# Patient Record
Sex: Female | Born: 1990 | Race: Black or African American | Hispanic: No | Marital: Single | Smoking: Former smoker
Health system: Southern US, Community
[De-identification: ages and names within clinical notes are randomized; demographics above are authoritative.]

## PROBLEM LIST (undated history)

## (undated) DIAGNOSIS — F419 Anxiety disorder, unspecified: Secondary | ICD-10-CM

## (undated) DIAGNOSIS — F329 Major depressive disorder, single episode, unspecified: Secondary | ICD-10-CM

## (undated) DIAGNOSIS — F32A Depression, unspecified: Secondary | ICD-10-CM

## (undated) DIAGNOSIS — N83209 Unspecified ovarian cyst, unspecified side: Secondary | ICD-10-CM

## (undated) DIAGNOSIS — F319 Bipolar disorder, unspecified: Secondary | ICD-10-CM

## (undated) DIAGNOSIS — G43909 Migraine, unspecified, not intractable, without status migrainosus: Secondary | ICD-10-CM

---
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
## 2010-03-20 ENCOUNTER — Inpatient Hospital Stay (HOSPITAL_COMMUNITY)
Admission: AD | Admit: 2010-03-20 | Discharge: 2010-03-20 | Payer: Self-pay | Source: Home / Self Care | Attending: Obstetrics and Gynecology | Admitting: Obstetrics and Gynecology

## 2010-04-22 ENCOUNTER — Ambulatory Visit: Admission: RE | Admit: 2010-04-22 | Payer: Self-pay | Source: Home / Self Care | Admitting: Obstetrics and Gynecology

## 2010-04-30 ENCOUNTER — Encounter: Payer: Self-pay | Admitting: Obstetrics & Gynecology

## 2010-04-30 ENCOUNTER — Other Ambulatory Visit: Payer: Self-pay | Admitting: Obstetrics & Gynecology

## 2010-04-30 ENCOUNTER — Encounter: Payer: Medicaid Other | Admitting: Obstetrics & Gynecology

## 2010-04-30 DIAGNOSIS — N841 Polyp of cervix uteri: Secondary | ICD-10-CM

## 2010-04-30 DIAGNOSIS — N939 Abnormal uterine and vaginal bleeding, unspecified: Secondary | ICD-10-CM

## 2010-04-30 DIAGNOSIS — N926 Irregular menstruation, unspecified: Secondary | ICD-10-CM

## 2010-04-30 LAB — CONVERTED CEMR LAB: Hepatitis B Surface Ag: NEGATIVE

## 2010-04-30 LAB — POCT PREGNANCY, URINE: Preg Test, Ur: NEGATIVE

## 2010-05-25 ENCOUNTER — Ambulatory Visit (HOSPITAL_COMMUNITY): Payer: Medicaid Other

## 2010-05-25 ENCOUNTER — Inpatient Hospital Stay (HOSPITAL_COMMUNITY): Admission: RE | Admit: 2010-05-25 | Payer: Medicaid Other | Source: Ambulatory Visit

## 2010-05-25 ENCOUNTER — Ambulatory Visit: Payer: Medicaid Other | Admitting: Obstetrics & Gynecology

## 2010-06-08 LAB — GC/CHLAMYDIA PROBE AMP, GENITAL
Chlamydia, DNA Probe: NEGATIVE
GC Probe Amp, Genital: NEGATIVE

## 2010-06-08 LAB — URINALYSIS, ROUTINE W REFLEX MICROSCOPIC
Bilirubin Urine: NEGATIVE
Hgb urine dipstick: NEGATIVE
Nitrite: NEGATIVE
Urobilinogen, UA: 0.2 mg/dL (ref 0.0–1.0)

## 2010-06-08 LAB — WET PREP, GENITAL: Trich, Wet Prep: NONE SEEN

## 2010-06-08 LAB — POCT PREGNANCY, URINE: Preg Test, Ur: NEGATIVE

## 2010-06-11 ENCOUNTER — Ambulatory Visit: Payer: Medicaid Other | Admitting: Obstetrics & Gynecology

## 2010-06-15 LAB — WET PREP, GENITAL
Trich, Wet Prep: NONE SEEN
Yeast Wet Prep HPF POC: NONE SEEN

## 2010-06-15 LAB — URINALYSIS, ROUTINE W REFLEX MICROSCOPIC
Glucose, UA: NEGATIVE mg/dL
Protein, ur: NEGATIVE mg/dL

## 2010-06-15 LAB — GC/CHLAMYDIA PROBE AMP, GENITAL
Chlamydia, DNA Probe: NEGATIVE
GC Probe Amp, Genital: NEGATIVE

## 2010-06-16 ENCOUNTER — Emergency Department (HOSPITAL_COMMUNITY)
Admission: EM | Admit: 2010-06-16 | Discharge: 2010-06-16 | Disposition: A | Discharge status: Home / Self Care | Payer: Medicaid Other | Attending: Emergency Medicine | Admitting: Emergency Medicine

## 2010-06-16 DIAGNOSIS — J029 Acute pharyngitis, unspecified: Secondary | ICD-10-CM | POA: Insufficient documentation

## 2010-06-16 DIAGNOSIS — J069 Acute upper respiratory infection, unspecified: Secondary | ICD-10-CM | POA: Insufficient documentation

## 2010-06-16 DIAGNOSIS — R599 Enlarged lymph nodes, unspecified: Secondary | ICD-10-CM | POA: Insufficient documentation

## 2010-06-19 NOTE — Progress Notes (Signed)
Yolanda Wall, BARTKO NO.:  [id number]  MEDICAL RECORD NO.:  [id number]           PATIENT TYPE:  A  LOCATION:  [facility]                   FACILITY:  [facility]  PHYSICIAN:  Elsie Lincoln, MD      DATE OF BIRTH:  April 10, 1990  DATE OF SERVICE:  04/30/2010                                 CLINIC NOTE  The patient is a 20 year old female who presents for pelvic pain and irregular bleeding from the MAU.  The patient was seen in the MAU on March 20, 2010.  The patient has very vague pain.  Does not seem to be occurring currently.  She does have problems with constipation.  She has never tried docusate sodium and MiraLax and she was told to try that today.  She did have an ultrasound, which showed a normal-sized uterus with no myometrial abnormalities.  The endometrium did show slightly thick with possible endometrial polyp, so we ordered a saline sonohysterogram today.  The right ovary was normal with a probable left corpus luteum. The patient does have long periods lasting for 14 days.  PAST MEDICAL HISTORY:  Denies all medical problems.  PAST SURGICAL HISTORY:  Denies all surgeries.  SOCIAL HISTORY:  No drinking or drugs.  Has a history of tobacco.  MEDICATIONS:  None.  ALLERGIES:  NONE.  GYNECOLOGIC HISTORY:  The patient is sexually active but not using any birth control.  She has never been pregnant.  She has never had any sexually transmitted disease that she is aware of.  The patient is only 19 and does not need Pap smear.  She was recently diagnosed with BV at the MAU.  PHYSICAL EXAMINATION:  VITAL SIGNS:  Temperature 98.2, pulse 80, blood pressure 124/70, weight 135, height 70 inches. GENERAL:  Well nourished, well developed in no apparent distress. HEENT:  Normocephalic, atraumatic. CHEST:  Normal. ABDOMEN:  Soft, nontender.  No rebound or guarding. GENITALIA:  Tanner V.  Vagina, pink, normal rugae.  GC and Chlamydia cultures were over negative in  December.  Cervix, closed, nontender.  No CMT.  Uterus, anteverted, nontender.  Adnexa, no masses, nontender.  The patient has completely benign exam. EXTREMITIES:  No edema.  ASSESSMENT AND PLAN:  A 20 year old female with pelvic pain that seems to be resolved. 1. Constipation.  Docusate sodium and MiraLax. 2. Saline sonohysterogram to evaluate for endometrial polyp. 3. Depo-Provera.  The patient has not had intercourse for 2 weeks     prior to this. 4. Return to clinic in 2 weeks to evaluate results if feeling fine.          ______________________________ Elsie Lincoln, MD    KL/MEDQ  D:  05/04/2010  T:  05/05/2010  Job:  [id number]

## 2010-07-02 ENCOUNTER — Ambulatory Visit: Payer: Medicaid Other

## 2010-11-06 ENCOUNTER — Emergency Department (HOSPITAL_COMMUNITY)
Admission: EM | Admit: 2010-11-06 | Discharge: 2010-11-06 | Disposition: A | Discharge status: Home / Self Care | Payer: Medicaid Other | Attending: Emergency Medicine | Admitting: Emergency Medicine

## 2010-11-06 ENCOUNTER — Emergency Department (HOSPITAL_COMMUNITY): Payer: Medicaid Other

## 2010-11-06 DIAGNOSIS — R1032 Left lower quadrant pain: Secondary | ICD-10-CM | POA: Insufficient documentation

## 2010-11-06 DIAGNOSIS — N83209 Unspecified ovarian cyst, unspecified side: Secondary | ICD-10-CM | POA: Insufficient documentation

## 2010-11-06 DIAGNOSIS — N949 Unspecified condition associated with female genital organs and menstrual cycle: Secondary | ICD-10-CM | POA: Insufficient documentation

## 2010-11-06 LAB — POCT I-STAT, CHEM 8
BUN: 10 mg/dL (ref 6–23)
Creatinine, Ser: 0.7 mg/dL (ref 0.50–1.10)
Glucose, Bld: 83 mg/dL (ref 70–99)
Hemoglobin: 11.9 g/dL — ABNORMAL LOW (ref 12.0–15.0)
Sodium: 138 mEq/L (ref 135–145)

## 2010-11-06 LAB — CBC
Hemoglobin: 11.3 g/dL — ABNORMAL LOW (ref 12.0–15.0)
MCH: 30.4 pg (ref 26.0–34.0)
MCHC: 34.6 g/dL (ref 30.0–36.0)
MCV: 87.9 fL (ref 78.0–100.0)
Platelets: 233 10*3/uL (ref 150–400)
RBC: 3.72 MIL/uL — ABNORMAL LOW (ref 3.87–5.11)

## 2010-11-06 LAB — POCT PREGNANCY, URINE: Preg Test, Ur: NEGATIVE

## 2010-11-06 LAB — DIFFERENTIAL
Lymphs Abs: 1.7 10*3/uL (ref 0.7–4.0)
Monocytes Relative: 7 % (ref 3–12)

## 2010-11-06 LAB — URINALYSIS, ROUTINE W REFLEX MICROSCOPIC

## 2010-11-07 LAB — URINE CULTURE
Colony Count: 50000
Culture  Setup Time: 201208102128

## 2011-04-19 ENCOUNTER — Encounter (HOSPITAL_COMMUNITY): Payer: Self-pay | Admitting: Emergency Medicine

## 2011-04-19 ENCOUNTER — Emergency Department (HOSPITAL_COMMUNITY)
Admission: EM | Admit: 2011-04-19 | Discharge: 2011-04-19 | Disposition: A | Discharge status: Home / Self Care | Payer: Medicaid Other | Attending: Emergency Medicine | Admitting: Emergency Medicine

## 2011-04-19 DIAGNOSIS — F17200 Nicotine dependence, unspecified, uncomplicated: Secondary | ICD-10-CM | POA: Insufficient documentation

## 2011-04-19 DIAGNOSIS — R599 Enlarged lymph nodes, unspecified: Secondary | ICD-10-CM | POA: Insufficient documentation

## 2011-04-19 DIAGNOSIS — J029 Acute pharyngitis, unspecified: Secondary | ICD-10-CM | POA: Insufficient documentation

## 2011-04-19 LAB — RAPID STREP SCREEN (MED CTR MEBANE ONLY): Streptococcus, Group A Screen (Direct): NEGATIVE

## 2011-04-19 MED ORDER — IBUPROFEN 600 MG PO TABS: 600.0000 mg | ORAL_TABLET | Freq: Four times a day (QID) | ORAL | Status: AC | PRN

## 2011-04-19 MED ORDER — PENICILLIN V POTASSIUM 500 MG PO TABS: 500.0000 mg | ORAL_TABLET | Freq: Four times a day (QID) | ORAL | Status: AC

## 2011-04-19 MED ORDER — OXYCODONE-ACETAMINOPHEN 5-325 MG PO TABS: 2.0000 | ORAL_TABLET | ORAL | Status: AC | PRN

## 2011-04-19 NOTE — ED Provider Notes (Signed)
History     CSN: [id number]  Arrival date & time 04/19/11  1610   First MD Initiated Contact with Patient 04/19/11 1800      Chief Complaint  Patient presents with  . Sore Throat    (Consider location/radiation/quality/duration/timing/severity/associated sxs/prior treatment) Patient is a 21 y.o. female presenting with pharyngitis. The history is provided by the patient. No language interpreter was used.  Sore Throat This is a new problem. Associated symptoms include a sore throat and swollen glands. Pertinent negatives include no fever, joint swelling, myalgias, nausea or vomiting. The symptoms are aggravated by swallowing and drinking. She has tried NSAIDs for the symptoms. The treatment provided no relief.   presents to the ER with a two-day history of a sore throat with swelling. Denies fever cough runny nose sister appears to feel like she just got out of a concert. Denies nausea vomiting diarrhea.   History reviewed. No pertinent past medical history.  History reviewed. No pertinent past surgical history.  No family history on file.  History  Substance Use Topics  . Smoking status: Current Everyday Smoker  . Smokeless tobacco: Not on file  . Alcohol Use: No    OB History    Grav Para Term Preterm Abortions TAB SAB Ect Mult Living                  Review of Systems  Constitutional: Negative for fever.  HENT: Positive for sore throat.   Gastrointestinal: Negative for nausea and vomiting.  Musculoskeletal: Negative for myalgias and joint swelling.  All other systems reviewed and are negative.    Allergies  Review of patient's allergies indicates no known allergies.  Home Medications   Current Outpatient Rx  Name Route Sig Dispense Refill  . IBUPROFEN 200 MG PO TABS Oral Take 200 mg by mouth every 6 (six) hours as needed. For pain      BP 98/64  Pulse 102  Temp(Src) 98.2 F (36.8 C) (Oral)  Resp 16  SpO2 98%  LMP 04/19/2011  Physical Exam  Nursing  note and vitals reviewed. Constitutional: She is oriented to person, place, and time. She appears well-developed and well-nourished.  HENT:  Head: Normocephalic and atraumatic.  Right Ear: Hearing normal. There is tenderness. No drainage or swelling. No middle ear effusion.  Left Ear: Tympanic membrane normal. There is tenderness. No drainage or swelling. Tympanic membrane is not injected and not bulging.  No middle ear effusion.  Mouth/Throat: Uvula is midline. Oropharyngeal exudate, posterior oropharyngeal edema and posterior oropharyngeal erythema present. No tonsillar abscesses.  Eyes: Conjunctivae and EOM are normal. Pupils are equal, round, and reactive to light.  Neck: Normal range of motion. Neck supple.  Cardiovascular: Normal rate, regular rhythm, normal heart sounds and intact distal pulses.  Exam reveals no gallop and no friction rub.   No murmur heard. Pulmonary/Chest: Effort normal and breath sounds normal.  Abdominal: Soft. Bowel sounds are normal.  Musculoskeletal: Normal range of motion. She exhibits no edema and no tenderness.  Neurological: She is alert and oriented to person, place, and time. She has normal reflexes.  Skin: Skin is warm and dry.  Psychiatric: She has a normal mood and affect.    ED Course  Procedures (including critical care time)   Labs Reviewed  RAPID STREP SCREEN   No results found.   No diagnosis found.    MDM  Patient here complaining of a sore throat x2 days with ear pain.  No fever nausea vomiting. Denies  cough, runny nose,  Headache, nausea or vomiting. . She does have oropharyngeal exudate and erythema.  Has had this in the past but tested negative for strep.  Will treat with pennicillin and percocet/ibuprofen for pain.Return if worse.  No pcp.        Jethro Bastos, NP 04/20/11 1147

## 2011-04-19 NOTE — ED Notes (Signed)
Lt tonsil pain since yesterday with a sl earache.  Painful swallowing

## 2011-04-19 NOTE — ED Notes (Signed)
Pt c/o sore throat onset last pm st's throat feels swollen

## 2011-04-20 NOTE — ED Provider Notes (Signed)
Medical screening examination/treatment/procedure(s) were performed by non-physician practitioner and as supervising physician I was immediately available for consultation/collaboration.   Benny Lennert, MD 04/20/11 1200

## 2012-02-22 ENCOUNTER — Encounter (HOSPITAL_COMMUNITY): Payer: Self-pay | Admitting: Emergency Medicine

## 2012-02-22 ENCOUNTER — Emergency Department (HOSPITAL_COMMUNITY)
Admission: EM | Admit: 2012-02-22 | Discharge: 2012-02-22 | Disposition: A | Discharge status: Home / Self Care | Payer: Self-pay | Attending: Emergency Medicine | Admitting: Emergency Medicine

## 2012-02-22 DIAGNOSIS — F17200 Nicotine dependence, unspecified, uncomplicated: Secondary | ICD-10-CM | POA: Insufficient documentation

## 2012-02-22 DIAGNOSIS — L02419 Cutaneous abscess of limb, unspecified: Secondary | ICD-10-CM | POA: Insufficient documentation

## 2012-02-22 DIAGNOSIS — L03119 Cellulitis of unspecified part of limb: Secondary | ICD-10-CM | POA: Insufficient documentation

## 2012-02-22 DIAGNOSIS — L0291 Cutaneous abscess, unspecified: Secondary | ICD-10-CM

## 2012-02-22 MED ORDER — SULFAMETHOXAZOLE-TRIMETHOPRIM 800-160 MG PO TABS: 1.0000 | ORAL_TABLET | Freq: Two times a day (BID) | ORAL | Status: DC

## 2012-02-22 MED ORDER — TRAMADOL HCL 50 MG PO TABS: 50.0000 mg | ORAL_TABLET | Freq: Four times a day (QID) | ORAL | Status: DC | PRN

## 2012-02-22 NOTE — ED Notes (Signed)
Pt with possible abscess to right upper leg with pain and swelling

## 2012-02-22 NOTE — ED Notes (Signed)
Pt noticed lump on inner right thigh last night, pain shooting down right leg when palpated. Pt A&Ox4, ambulatory, nad.

## 2012-02-22 NOTE — ED Provider Notes (Signed)
History     CSN: [id number]  Arrival date & time 02/22/12  1610   First MD Initiated Contact with Patient 02/22/12 1751      Chief Complaint  Patient presents with  . Abscess    (Consider location/radiation/quality/duration/timing/severity/associated sxs/prior treatment) HPI  Yolanda Wall is a 21 y.o. female complaining of painful swelling to right upper thigh worsening over the course last 3 days. Patient denies prior similar episodes, fever, nausea vomiting. Pain is rated at 7/10 it is exacerbated by movement. She's been taking Motrin with little relief. Denies direct trauma to the area history of diabetes, use of steroids, immunosuppression or HIV RF.   History reviewed. No pertinent past medical history.  History reviewed. No pertinent past surgical history.  History reviewed. No pertinent family history.  History  Substance Use Topics  . Smoking status: Current Every Day Smoker  . Smokeless tobacco: Not on file  . Alcohol Use: Yes    OB History    Grav Para Term Preterm Abortions TAB SAB Ect Mult Living                  Review of Systems  Constitutional: Negative for fever.  Respiratory: Negative for shortness of breath.   Cardiovascular: Negative for chest pain.  Gastrointestinal: Negative for nausea, vomiting, abdominal pain and diarrhea.  Skin: Positive for rash.  All other systems reviewed and are negative.    Allergies  Review of patient's allergies indicates no known allergies.  Home Medications   Current Outpatient Rx  Name  Route  Sig  Dispense  Refill  . IBUPROFEN 200 MG PO TABS   Oral   Take 200 mg by mouth every 6 (six) hours as needed. For pain           BP 103/57  Pulse 74  Temp 98.2 F (36.8 C) (Oral)  Resp 18  SpO2 100%  Physical Exam  Nursing note and vitals reviewed. Constitutional: She is oriented to person, place, and time. She appears well-developed and well-nourished. No distress.  HENT:  Head: Normocephalic.    Mouth/Throat: Oropharynx is clear and moist.  Eyes: Conjunctivae normal and EOM are normal. Pupils are equal, round, and reactive to light.  Cardiovascular: Normal rate.   Pulmonary/Chest: Effort normal. No stridor.  Abdominal: Bowel sounds are normal. She exhibits no distension and no mass. There is no tenderness. There is no rebound and no guarding.  Musculoskeletal: Normal range of motion.  Neurological: She is alert and oriented to person, place, and time.  Skin:       8 mm area of induration to right upper anterior thigh surrounded by 3 cm of diffuse erythema. No fluctuance.  Psychiatric: She has a normal mood and affect.    ED Course  Procedures (including critical care time)  Labs Reviewed - No data to display No results found.   1. Abscess       MDM  Discussed options for incision and drainage versus warm compresses and take that and antibiotics. Patient deferred I&D today. I instructed her to return to the emergency room if fluctuance develops or if redness starts spreading rapidly  Shared Visit with attending Dr. Ignacia Palma.   Pt verbalized understanding and agrees with care plan. Outpatient follow-up and return precautions given.    New Prescriptions   SULFAMETHOXAZOLE-TRIMETHOPRIM (SEPTRA DS) 800-160 MG PER TABLET    Take 1 tablet by mouth every 12 (twelve) hours.   TRAMADOL (ULTRAM) 50 MG TABLET    Take  1 tablet (50 mg total) by mouth every 6 (six) hours as needed for pain.        Wynetta Emery, PA-C 02/22/12 2059

## 2012-02-23 NOTE — ED Provider Notes (Signed)
Medical screening examination/treatment/procedure(s) were conducted as a shared visit with non-physician practitioner(s) and myself.  I personally evaluated the patient during the encounter Has area of induration and redness, but no fluctuance.  Advised antibiotic treatment, return if develops fluctuance or develops fever.  Carleene Cooper III, MD 02/23/12 2039

## 2012-02-27 ENCOUNTER — Emergency Department (HOSPITAL_COMMUNITY)
Admission: EM | Admit: 2012-02-27 | Discharge: 2012-02-27 | Disposition: A | Discharge status: Home / Self Care | Payer: Self-pay | Attending: Emergency Medicine | Admitting: Emergency Medicine

## 2012-02-27 ENCOUNTER — Encounter (HOSPITAL_COMMUNITY): Payer: Self-pay | Admitting: Nurse Practitioner

## 2012-02-27 DIAGNOSIS — F17200 Nicotine dependence, unspecified, uncomplicated: Secondary | ICD-10-CM | POA: Insufficient documentation

## 2012-02-27 DIAGNOSIS — L02419 Cutaneous abscess of limb, unspecified: Secondary | ICD-10-CM | POA: Insufficient documentation

## 2012-02-27 DIAGNOSIS — Z8742 Personal history of other diseases of the female genital tract: Secondary | ICD-10-CM | POA: Insufficient documentation

## 2012-02-27 HISTORY — DX: Unspecified ovarian cyst, unspecified side: N83.209

## 2012-02-27 MED ORDER — OXYCODONE-ACETAMINOPHEN 5-325 MG PO TABS: 1.0000 | ORAL_TABLET | Freq: Four times a day (QID) | ORAL | Status: DC | PRN

## 2012-02-27 MED ORDER — CLINDAMYCIN HCL 150 MG PO CAPS: 150.0000 mg | ORAL_CAPSULE | Freq: Four times a day (QID) | ORAL | Status: DC

## 2012-02-27 NOTE — ED Notes (Addendum)
Pt was diagnosed with abcess to anterior R thigh here Tuesday, sent home on septra PO. Reports abscess is not getting any better and pain/drainage continues. Has been taking septra as prescribed

## 2012-02-27 NOTE — ED Provider Notes (Signed)
History     CSN: [id number]  Arrival date & time 02/27/12  1528   First MD Initiated Contact with Patient 02/27/12 1639      Chief Complaint  Patient presents with  . leg abcess     (Consider location/radiation/quality/duration/timing/severity/associated sxs/prior treatment) HPI Pt presenting with drainage and pain in area of right thigh abscess.  She was seen in the ED 5 days ago and diagnosed with earlyabscess/cellulitis of right thigh.  She has been taking bactrim x 5 days as well as doing warm compresses.  She states the area of redness has decreased and the center of the abscess began draining pus and blood yesterday.  She continues to have pain in the area and states tramadol is upsetting her stomach.  No fever/chills.  There are no other associated systemic symptoms, there are no other alleviating or modifying factors.   Past Medical History  Diagnosis Date  . Ovarian cyst     History reviewed. No pertinent past surgical history.  History reviewed. No pertinent family history.  History  Substance Use Topics  . Smoking status: Current Every Day Smoker  . Smokeless tobacco: Not on file  . Alcohol Use: Yes    OB History    Grav Para Term Preterm Abortions TAB SAB Ect Mult Living                  Review of Systems ROS reviewed and all otherwise negative except for mentioned in HPI  Allergies  Tramadol  Home Medications   Current Outpatient Rx  Name  Route  Sig  Dispense  Refill  . SULFAMETHOXAZOLE-TRIMETHOPRIM 800-160 MG PO TABS   Oral   Take 1 tablet by mouth every 12 (twelve) hours.   14 tablet   0   . TRAMADOL HCL 50 MG PO TABS   Oral   Take 1 tablet (50 mg total) by mouth every 6 (six) hours as needed for pain.   15 tablet   0   . CLINDAMYCIN HCL 150 MG PO CAPS   Oral   Take 1 capsule (150 mg total) by mouth every 6 (six) hours.   28 capsule   0   . OXYCODONE-ACETAMINOPHEN 5-325 MG PO TABS   Oral   Take 1-2 tablets by mouth every 6 (six)  hours as needed for pain.   15 tablet   0     BP 131/56  Pulse 81  Temp 98.4 F (36.9 C) (Oral)  Resp 16  SpO2 96% Vitals reviewed Physical Exam Physical Examination: General appearance - alert, well appearing, and in no distress Mental status - alert, oriented to person, place, and time Eyes - no conjunctival injection, no scleral icterus Mouth - mucous membranes moist, pharynx normal without lesions Chest - clear to auscultation, no wheezes, rales or rhonchi, symmetric air entry Heart - normal rate, regular rhythm, normal S1, S2, no murmurs, rubs, clicks or gallops Abdomen - soft, nontender, nondistended, no masses or organomegaly Extremities - peripheral pulses normal, no pedal edema, no clubbing or cyanosis Skin - normal coloration and turgor, on right thigh there is approx 4cm area of ertyeham with central draining area- actively draining pus and blood- approx 1area surrounding of induration.    ED Course  Procedures (including critical care time)  Labs Reviewed - No data to display No results found.   1. Abscess of thigh       MDM  Pt presenting with c/o pus draining from area of abscess.  She  states the area of redness has decreased.  I have expressed a moderate amount of prurulent drainage from the already freely draining  Abscess.  Will give rx for clindamycin, change pain medication prescription, advised to continue warm compresses.  Discharged with strict return precautions.  Pt agreeable with plan.        Ethelda Chick, MD 02/27/12 [id number]

## 2012-06-21 ENCOUNTER — Emergency Department (HOSPITAL_COMMUNITY)
Admission: EM | Admit: 2012-06-21 | Discharge: 2012-06-21 | Disposition: A | Discharge status: Home / Self Care | Payer: Self-pay | Attending: Emergency Medicine | Admitting: Emergency Medicine

## 2012-06-21 ENCOUNTER — Emergency Department (HOSPITAL_COMMUNITY): Payer: Self-pay

## 2012-06-21 DIAGNOSIS — Z3202 Encounter for pregnancy test, result negative: Secondary | ICD-10-CM | POA: Insufficient documentation

## 2012-06-21 DIAGNOSIS — N760 Acute vaginitis: Secondary | ICD-10-CM | POA: Insufficient documentation

## 2012-06-21 DIAGNOSIS — N83209 Unspecified ovarian cyst, unspecified side: Secondary | ICD-10-CM | POA: Insufficient documentation

## 2012-06-21 DIAGNOSIS — N949 Unspecified condition associated with female genital organs and menstrual cycle: Secondary | ICD-10-CM | POA: Insufficient documentation

## 2012-06-21 DIAGNOSIS — F17200 Nicotine dependence, unspecified, uncomplicated: Secondary | ICD-10-CM | POA: Insufficient documentation

## 2012-06-21 DIAGNOSIS — R102 Pelvic and perineal pain: Secondary | ICD-10-CM

## 2012-06-21 DIAGNOSIS — N83201 Unspecified ovarian cyst, right side: Secondary | ICD-10-CM

## 2012-06-21 LAB — URINE MICROSCOPIC-ADD ON

## 2012-06-21 LAB — URINALYSIS, ROUTINE W REFLEX MICROSCOPIC
Glucose, UA: NEGATIVE mg/dL
Specific Gravity, Urine: 1.028 (ref 1.005–1.030)
pH: 6 (ref 5.0–8.0)

## 2012-06-21 LAB — WET PREP, GENITAL: Yeast Wet Prep HPF POC: NONE SEEN

## 2012-06-21 LAB — PREGNANCY, URINE: Preg Test, Ur: NEGATIVE

## 2012-06-21 LAB — POCT PREGNANCY, URINE: Preg Test, Ur: NEGATIVE

## 2012-06-21 MED ORDER — HYDROCODONE-ACETAMINOPHEN 5-325 MG PO TABS
1.0000 | ORAL_TABLET | Freq: Once | ORAL | Status: AC
  Administered 2012-06-21: 1 via ORAL
  Filled 2012-06-21: qty 1

## 2012-06-21 MED ORDER — KETOROLAC TROMETHAMINE 60 MG/2ML IM SOLN
60.0000 mg | Freq: Once | INTRAMUSCULAR | Status: AC
  Administered 2012-06-21: 60 mg via INTRAMUSCULAR
  Filled 2012-06-21: qty 2

## 2012-06-21 MED ORDER — METRONIDAZOLE 500 MG PO TABS: 500.0000 mg | ORAL_TABLET | Freq: Two times a day (BID) | ORAL | Status: DC

## 2012-06-21 MED ORDER — HYDROCODONE-ACETAMINOPHEN 5-325 MG PO TABS: 1.0000 | ORAL_TABLET | Freq: Four times a day (QID) | ORAL | Status: DC | PRN

## 2012-06-21 MED ORDER — IBUPROFEN 800 MG PO TABS: 800.0000 mg | ORAL_TABLET | Freq: Three times a day (TID) | ORAL | Status: DC

## 2012-06-21 NOTE — ED Provider Notes (Signed)
History     CSN: [id number]  Arrival date & time 06/21/12  1056   First MD Initiated Contact with Patient 06/21/12 1114      Chief Complaint  Patient presents with  . Ovarian Cyst  . Abdominal Pain    (Consider location/radiation/quality/duration/timing/severity/associated sxs/prior treatment) HPI Yolanda Wall is a 22 y.o. female who presents to ED with left sided pelvic pain. States pain started yesterday. States also started her period. Reports hx of the same, states was told in the past she has an ovarian cyst. states used to be followed by PCP, who gave her ibuprofen and vicodin for her pain. States this feels different from regular menstrual cramps. States no urinary symptoms. Not pregnant. No n/v/d. No fever.   Past Medical History  Diagnosis Date  . Ovarian cyst     No past surgical history on file.  No family history on file.  History  Substance Use Topics  . Smoking status: Current Every Day Smoker  . Smokeless tobacco: Not on file  . Alcohol Use: Yes    OB History   Grav Para Term Preterm Abortions TAB SAB Ect Mult Living                  Review of Systems  Constitutional: Negative for fever and chills.  HENT: Negative for neck pain and neck stiffness.   Respiratory: Negative.   Cardiovascular: Negative.   Gastrointestinal: Positive for abdominal pain. Negative for nausea and vomiting.  Genitourinary: Positive for vaginal bleeding and pelvic pain. Negative for dysuria, flank pain, vaginal discharge and vaginal pain.  Skin: Negative.   Neurological: Negative for weakness and numbness.    Allergies  Tramadol  Home Medications   Current Outpatient Rx  Name  Route  Sig  Dispense  Refill  . ibuprofen (ADVIL,MOTRIN) 200 MG tablet   Oral   Take 400 mg by mouth every 6 (six) hours as needed for pain.           BP 122/66  Wall 70  Temp(Src) 97.8 F (36.6 C) (Oral)  Resp 22  SpO2 100%  Physical Exam  Nursing note and vitals  reviewed. Constitutional: She appears well-developed and well-nourished. No distress.  Eyes: Conjunctivae are normal.  Neck: Neck supple.  Cardiovascular: Normal rate, regular rhythm and normal heart sounds.   Pulmonary/Chest: Effort normal and breath sounds normal. No respiratory distress. She has no wheezes. She has no rales.  Abdominal: Soft. Bowel sounds are normal. She exhibits no distension. There is tenderness. There is no rebound.  Suprapubic and left lower quadrant tenderness  Genitourinary:  Normal external genitalia, thin white vaginal discharge. No CMT, left adnexal tenderness.   Neurological: She is alert.  Skin: Skin is warm and dry.    ED Course  Procedures (including critical care time)  Pt with hx of ovarian cysts, here with lower abdominal/pelvic pain, left adnexal pain. Will gt UA, urine pregnancy, wet prep, gc chlamydia, US pelvis to r/o ovarian torsion.   Results for orders placed during the hospital encounter of 06/21/12  WET PREP, GENITAL      Result Value Range   Yeast Wet Prep HPF POC NONE SEEN  NONE SEEN   Trich, Wet Prep NONE SEEN  NONE SEEN   Clue Cells Wet Prep HPF POC MODERATE (*) NONE SEEN   WBC, Wet Prep HPF POC FEW (*) NONE SEEN  URINALYSIS, ROUTINE W REFLEX MICROSCOPIC      Result Value Range   Color, Urine  YELLOW  YELLOW   APPearance CLEAR  CLEAR   Specific Gravity, Urine 1.028  1.005 - 1.030   pH 6.0  5.0 - 8.0   Glucose, UA NEGATIVE  NEGATIVE mg/dL   Hgb urine dipstick SMALL (*) NEGATIVE   Bilirubin Urine NEGATIVE  NEGATIVE   Ketones, ur NEGATIVE  NEGATIVE mg/dL   Protein, ur NEGATIVE  NEGATIVE mg/dL   Urobilinogen, UA 0.2  0.0 - 1.0 mg/dL   Nitrite NEGATIVE  NEGATIVE   Leukocytes, UA NEGATIVE  NEGATIVE  PREGNANCY, URINE      Result Value Range   Preg Test, Ur NEGATIVE  NEGATIVE  URINE MICROSCOPIC-ADD ON      Result Value Range   RBC / HPF 3-6  <3 RBC/hpf   Bacteria, UA FEW (*) RARE   Urine-Other MUCOUS PRESENT     US Transvaginal  Non-ob  06/21/2012  *RADIOLOGY REPORT*  Clinical Data:  Ovarian cyst and abdominal pain.  TRANSABDOMINAL AND TRANSVAGINAL ULTRASOUND OF PELVIS DOPPLER ULTRASOUND OF OVARIES  Technique:  Both transabdominal and transvaginal ultrasound examinations of the pelvis were performed. Transabdominal technique was performed for global imaging of the pelvis including uterus, ovaries, adnexal regions, and pelvic cul-de-sac.  It was necessary to proceed with endovaginal exam following the transabdominal exam to visualize the uterus, endometrium, ovaries and adnexal regions.  Color and duplex Doppler ultrasound was utilized to evaluate blood flow to the ovaries.  Comparison:  11/06/2010.  Findings:  Uterus:  Measures 7.3 x 3.2 x 4.0 cm, uniform in parenchymal echogenicity.  Endometrium:  Measures 7 mm, within normal limits.  Right ovary: Measures 3.4 x 1.9 x 2.1 cm, negative.  Left ovary:   Measures 3.9 x 2.7 x 2.8 cm and contains a complex area measuring 1.3 x 1.1 x 1.1 cm, possibly representing a hemorrhagic follicle.  Pulsed Doppler evaluation demonstrates normal low-resistance arterial and venous waveforms in both ovaries.  IMPRESSION:  1.  Possible small hemorrhagic follicle in the left ovary.  2.  No sonographic evidence for ovarian torsion.   Original Report Authenticated By: Leanna Battles, M.D.    US Pelvis Complete  06/21/2012  *RADIOLOGY REPORT*  Clinical Data:  Ovarian cyst and abdominal pain.  TRANSABDOMINAL AND TRANSVAGINAL ULTRASOUND OF PELVIS DOPPLER ULTRASOUND OF OVARIES  Technique:  Both transabdominal and transvaginal ultrasound examinations of the pelvis were performed. Transabdominal technique was performed for global imaging of the pelvis including uterus, ovaries, adnexal regions, and pelvic cul-de-sac.  It was necessary to proceed with endovaginal exam following the transabdominal exam to visualize the uterus, endometrium, ovaries and adnexal regions.  Color and duplex Doppler ultrasound was utilized  to evaluate blood flow to the ovaries.  Comparison:  11/06/2010.  Findings:  Uterus:  Measures 7.3 x 3.2 x 4.0 cm, uniform in parenchymal echogenicity.  Endometrium:  Measures 7 mm, within normal limits.  Right ovary: Measures 3.4 x 1.9 x 2.1 cm, negative.  Left ovary:   Measures 3.9 x 2.7 x 2.8 cm and contains a complex area measuring 1.3 x 1.1 x 1.1 cm, possibly representing a hemorrhagic follicle.  Pulsed Doppler evaluation demonstrates normal low-resistance arterial and venous waveforms in both ovaries.  IMPRESSION:  1.  Possible small hemorrhagic follicle in the left ovary.  2.  No sonographic evidence for ovarian torsion.   Original Report Authenticated By: Leanna Battles, M.D.    [place] Art/ven Flow Abd Pelv Doppler  06/21/2012  *RADIOLOGY REPORT*  Clinical Data:  Ovarian cyst and abdominal pain.  TRANSABDOMINAL AND TRANSVAGINAL ULTRASOUND  OF PELVIS DOPPLER ULTRASOUND OF OVARIES  Technique:  Both transabdominal and transvaginal ultrasound examinations of the pelvis were performed. Transabdominal technique was performed for global imaging of the pelvis including uterus, ovaries, adnexal regions, and pelvic cul-de-sac.  It was necessary to proceed with endovaginal exam following the transabdominal exam to visualize the uterus, endometrium, ovaries and adnexal regions.  Color and duplex Doppler ultrasound was utilized to evaluate blood flow to the ovaries.  Comparison:  11/06/2010.  Findings:  Uterus:  Measures 7.3 x 3.2 x 4.0 cm, uniform in parenchymal echogenicity.  Endometrium:  Measures 7 mm, within normal limits.  Right ovary: Measures 3.4 x 1.9 x 2.1 cm, negative.  Left ovary:   Measures 3.9 x 2.7 x 2.8 cm and contains a complex area measuring 1.3 x 1.1 x 1.1 cm, possibly representing a hemorrhagic follicle.  Pulsed Doppler evaluation demonstrates normal low-resistance arterial and venous waveforms in both ovaries.  IMPRESSION:  1.  Possible small hemorrhagic follicle in the left ovary.  2.  No sonographic  evidence for ovarian torsion.   Original Report Authenticated By: Leanna Battles, M.D.       1. Pelvic pain   2. Ovarian cyst, right   3. Bacterial vaginosis       MDM  PT with left adnexal pain. US showed 1x1cm left hemorrhagic follicle. PTwith hx of the same. Will need GYN follow up. NO torsion. No signs of PID. Pt non toxic. VS normal. Discussed results with pt. Will d/c home with follow up.   Filed Vitals:   06/21/12 1116  BP: 122/66  Wall: 70  Temp: 97.8 F (36.6 C)  TempSrc: Oral  Resp: 22  SpO2: 100%           Simaya Lumadue A Liron Eissler, PA-C 06/21/12 1410

## 2012-06-21 NOTE — ED Notes (Signed)
Pt reports left sided abdominal pain due to an "cyst on the ovary." Pt states when she is on her menstrual period with pain gets way worse. Pt states she was prescribed hydrocodone and ibuprofen by her PCP for this pain, but she is out of medication and has had to "transition" so she does not have a primary doctor right now.

## 2012-06-23 NOTE — ED Provider Notes (Signed)
Medical screening examination/treatment/procedure(s) were performed by non-physician practitioner and as supervising physician I was immediately available for consultation/collaboration.   Jeffrie Lofstrom M Carmelina Balducci, DO 06/23/12 1829 

## 2012-07-12 ENCOUNTER — Encounter: Payer: Medicaid Other | Admitting: Family Medicine

## 2012-08-27 ENCOUNTER — Emergency Department (HOSPITAL_COMMUNITY): Payer: Self-pay

## 2012-08-27 ENCOUNTER — Encounter (HOSPITAL_COMMUNITY): Payer: Self-pay | Admitting: Nurse Practitioner

## 2012-08-27 ENCOUNTER — Emergency Department (HOSPITAL_COMMUNITY)
Admission: EM | Admit: 2012-08-27 | Discharge: 2012-08-27 | Disposition: A | Discharge status: Home / Self Care | Payer: Self-pay | Attending: Emergency Medicine | Admitting: Emergency Medicine

## 2012-08-27 DIAGNOSIS — F419 Anxiety disorder, unspecified: Secondary | ICD-10-CM

## 2012-08-27 DIAGNOSIS — F411 Generalized anxiety disorder: Secondary | ICD-10-CM | POA: Insufficient documentation

## 2012-08-27 DIAGNOSIS — Z87448 Personal history of other diseases of urinary system: Secondary | ICD-10-CM | POA: Insufficient documentation

## 2012-08-27 DIAGNOSIS — R0789 Other chest pain: Secondary | ICD-10-CM | POA: Insufficient documentation

## 2012-08-27 DIAGNOSIS — Z79899 Other long term (current) drug therapy: Secondary | ICD-10-CM | POA: Insufficient documentation

## 2012-08-27 DIAGNOSIS — Z3202 Encounter for pregnancy test, result negative: Secondary | ICD-10-CM | POA: Insufficient documentation

## 2012-08-27 DIAGNOSIS — F17200 Nicotine dependence, unspecified, uncomplicated: Secondary | ICD-10-CM | POA: Insufficient documentation

## 2012-08-27 DIAGNOSIS — R079 Chest pain, unspecified: Secondary | ICD-10-CM

## 2012-08-27 LAB — CBC
MCHC: 35.5 g/dL (ref 30.0–36.0)
RDW: 12.5 % (ref 11.5–15.5)

## 2012-08-27 LAB — BASIC METABOLIC PANEL
BUN: 12 mg/dL (ref 6–23)
Calcium: 9.7 mg/dL (ref 8.4–10.5)
GFR calc Af Amer: >90 mL/min (ref >90)
GFR calc non Af Amer: >90 mL/min (ref >90)
Potassium: 4.2 mEq/L (ref 3.5–5.1)
Sodium: 133 mEq/L — ABNORMAL LOW (ref 135–145)

## 2012-08-27 MED ORDER — LORAZEPAM 1 MG PO TABS: 1.0000 mg | ORAL_TABLET | Freq: Two times a day (BID) | ORAL | Status: DC | PRN

## 2012-08-27 MED ORDER — GI COCKTAIL ~~LOC~~
30.0000 mL | Freq: Once | ORAL | Status: AC
  Administered 2012-08-27: 30 mL via ORAL
  Filled 2012-08-27: qty 30

## 2012-08-27 MED ORDER — LORAZEPAM 1 MG PO TABS
1.0000 mg | ORAL_TABLET | Freq: Once | ORAL | Status: AC
  Administered 2012-08-27: 1 mg via ORAL
  Filled 2012-08-27: qty 1

## 2012-08-27 NOTE — ED Notes (Signed)
Pt reports constant pressure in center of chest for past 3 days, then today her L shoulder began to hurt and her R arm started tingling. Pt states she was nauseated all day yesterday and vomited in the morning. Pt had a heart murmur as a child but no other cardiac history. A&Ox4, resp e/u

## 2012-08-27 NOTE — ED Provider Notes (Signed)
Medical screening examination/treatment/procedure(s) were performed by non-physician practitioner and as supervising physician I was immediately available for consultation/collaboration.  Cashis Rill M Nadege Carriger, MD 08/27/12 1623 

## 2012-08-27 NOTE — ED Notes (Signed)
Pt taken to radiology

## 2012-08-27 NOTE — ED Provider Notes (Signed)
History     CSN: [id number]  Arrival date & time 08/27/12  1150   First MD Initiated Contact with Patient 08/27/12 1158      Chief Complaint  Patient presents with  . Chest Pain    (Consider location/radiation/quality/duration/timing/severity/associated sxs/prior treatment) HPI Comments: Patient presents emergency department with chief complaint of chest pain x3 days. She states that his been constant, persistent chest pain, that feels like a tightness or pressure on her chest. She states that today her left shoulder began to hurt, and her right arm started to tingle. She felt nauseated yesterday. She endorses heart murmur as child, but has no other cardiac history. She denies any family history of sudden death, or early heart disease. She states that the chest pain is 10 out of 10. Nothing makes it better. It is worsened with smoking. She denies fever, chills, diarrhea, constipation.  The history is provided by the patient. No language interpreter was used.    Past Medical History  Diagnosis Date  . Ovarian cyst     History reviewed. No pertinent past surgical history.  History reviewed. No pertinent family history.  History  Substance Use Topics  . Smoking status: Current Every Day Smoker  . Smokeless tobacco: Not on file  . Alcohol Use: Yes    OB History   Grav Para Term Preterm Abortions TAB SAB Ect Mult Living                  Review of Systems  All other systems reviewed and are negative.    Allergies  Tramadol  Home Medications   Current Outpatient Rx  Name  Route  Sig  Dispense  Refill  . HYDROcodone-acetaminophen (NORCO) 5-325 MG per tablet   Oral   Take 1 tablet by mouth every 6 (six) hours as needed for pain.   20 tablet   0   . ibuprofen (ADVIL,MOTRIN) 200 MG tablet   Oral   Take 400 mg by mouth every 6 (six) hours as needed for pain.         Marland Kitchen ibuprofen (ADVIL,MOTRIN) 800 MG tablet   Oral   Take 1 tablet (800 mg total) by mouth 3 (three)  times daily.   21 tablet   0   . metroNIDAZOLE (FLAGYL) 500 MG tablet   Oral   Take 1 tablet (500 mg total) by mouth 2 (two) times daily.   14 tablet   0     BP 132/77  Pulse 100  Temp(Src) 99.3 F (37.4 C) (Oral)  Resp 20  SpO2 100%  Physical Exam  Nursing note and vitals reviewed. Constitutional: She is oriented to person, place, and time. She appears well-developed and well-nourished.  HENT:  Head: Normocephalic and atraumatic.  Eyes: Conjunctivae and EOM are normal. Pupils are equal, round, and reactive to light.  Neck: Normal range of motion. Neck supple.  Cardiovascular: Normal rate and regular rhythm.  Exam reveals no gallop and no friction rub.   No murmur heard. Pulmonary/Chest: Effort normal and breath sounds normal. No respiratory distress. She has no wheezes. She has no rales. She exhibits tenderness.  Mildly tender to palpation over the sternum and left chest wall  Abdominal: Soft. Bowel sounds are normal. She exhibits no distension and no mass. There is no tenderness. There is no rebound and no guarding.  Musculoskeletal: Normal range of motion. She exhibits no edema and no tenderness.  Neurological: She is alert and oriented to person, place, and time.  Skin: Skin is warm and dry.  Psychiatric: She has a normal mood and affect. Her behavior is normal. Judgment and thought content normal.    ED Course  Procedures (including critical care time)  Labs Reviewed  CBC  BASIC METABOLIC PANEL   Results for orders placed during the hospital encounter of 08/27/12  CBC      Result Value Range   WBC 7.4  4.0 - 10.5 K/uL   RBC 3.99  3.87 - 5.11 MIL/uL   Hemoglobin 12.5  12.0 - 15.0 g/dL   HCT 11.9 (*) 14.7 - 82.9 %   MCV 88.2  78.0 - 100.0 fL   MCH 31.3  26.0 - 34.0 pg   MCHC 35.5  30.0 - 36.0 g/dL   RDW 56.2  13.0 - 86.5 %   Platelets 229  150 - 400 K/uL  BASIC METABOLIC PANEL      Result Value Range   Sodium 133 (*) 135 - 145 mEq/L   Potassium 4.2  3.5 -  5.1 mEq/L   Chloride 100  96 - 112 mEq/L   CO2 22  19 - 32 mEq/L   Glucose, Bld 76  70 - 99 mg/dL   BUN 12  6 - 23 mg/dL   Creatinine, Ser 7.84  0.50 - 1.10 mg/dL   Calcium 9.7  8.4 - 69.6 mg/dL   GFR calc non Af Amer >90  >90 mL/min   GFR calc Af Amer >90  >90 mL/min  POCT I-STAT TROPONIN I      Result Value Range   Troponin i, poc 0.01  0.00 - 0.08 ng/mL   Comment 3           POCT PREGNANCY, URINE      Result Value Range   Preg Test, Ur NEGATIVE  NEGATIVE   Dg Chest 2 View  08/27/2012   *RADIOLOGY REPORT*  Clinical Data: Left sided chest pain for 3 days.  Smoker.  CHEST - 2 VIEW  Comparison: None.  Findings: Midline trachea.  Normal heart size and mediastinal contours. No pleural effusion or pneumothorax.  IMPRESSION: No acute cardiopulmonary disease.   Original Report Authenticated By: Jeronimo Greaves, M.D.      1. Chest pain   2. Anxiety       MDM  Patient with chest pain x3 days. Will check basic labs, EKG, and chest x-ray, will reevaluate. Doubt ACS, given the patient's age.  Patient re-evaluated several times.  GI cocktail did not help.  Patient given ativan, and states that she notices significant relief of her symptoms.  When questioned further, she states that she is has a lot of stress in her life, and when asked directly if this could be anxiety related, she said she thinks so.  Labs, CXR, and workup today are unremarkable.  I believe that the cause of the patient's unspecified chest pain is anxiety.  Follow-up with PCP.  Patient understands and agrees with the plan.  VSS. Patient discharged home in good condition.       Roxy Horseman, PA-C 08/27/12 1545

## 2012-11-04 ENCOUNTER — Encounter (HOSPITAL_COMMUNITY): Payer: Self-pay | Admitting: Emergency Medicine

## 2012-11-04 ENCOUNTER — Emergency Department (HOSPITAL_COMMUNITY)
Admission: EM | Admit: 2012-11-04 | Discharge: 2012-11-04 | Discharge status: Against Medical Advice | Payer: Self-pay | Attending: Emergency Medicine | Admitting: Emergency Medicine

## 2012-11-04 DIAGNOSIS — R1110 Vomiting, unspecified: Secondary | ICD-10-CM | POA: Insufficient documentation

## 2012-11-04 DIAGNOSIS — R197 Diarrhea, unspecified: Secondary | ICD-10-CM | POA: Insufficient documentation

## 2012-11-04 LAB — LIPASE, BLOOD: Lipase: 14 U/L (ref 11–59)

## 2012-11-04 LAB — COMPREHENSIVE METABOLIC PANEL
AST: 17 U/L (ref 0–37)
BUN: 9 mg/dL (ref 6–23)
CO2: 26 mEq/L (ref 19–32)
Calcium: 9.8 mg/dL (ref 8.4–10.5)
Creatinine, Ser: 0.67 mg/dL (ref 0.50–1.10)
GFR calc Af Amer: >90 mL/min (ref >90)
GFR calc non Af Amer: >90 mL/min (ref >90)
Glucose, Bld: 88 mg/dL (ref 70–99)

## 2012-11-04 LAB — CBC WITH DIFFERENTIAL/PLATELET
Basophils Absolute: 0 10*3/uL (ref 0.0–0.1)
Eosinophils Relative: 0 % (ref 0–5)
HCT: 35.9 % — ABNORMAL LOW (ref 36.0–46.0)
Hemoglobin: 12.2 g/dL (ref 12.0–15.0)
Lymphocytes Relative: 29 % (ref 12–46)
MCV: 91.3 fL (ref 78.0–100.0)
Monocytes Absolute: 0.5 10*3/uL (ref 0.1–1.0)
Monocytes Relative: 6 % (ref 3–12)
RDW: 12.7 % (ref 11.5–15.5)
WBC: 8.3 10*3/uL (ref 4.0–10.5)

## 2012-11-04 LAB — URINALYSIS, ROUTINE W REFLEX MICROSCOPIC
Ketones, ur: NEGATIVE mg/dL
Leukocytes, UA: NEGATIVE
Protein, ur: NEGATIVE mg/dL
Urobilinogen, UA: 1 mg/dL (ref 0.0–1.0)

## 2012-11-04 LAB — POCT PREGNANCY, URINE: Preg Test, Ur: NEGATIVE

## 2012-11-04 NOTE — ED Notes (Signed)
Pt called for with no answer 

## 2012-11-04 NOTE — ED Notes (Signed)
Called for pt with no answer 

## 2012-11-04 NOTE — ED Notes (Signed)
Patient presents to ED with complaints of N/V/D for 2 days.

## 2013-08-30 ENCOUNTER — Ambulatory Visit: Payer: Self-pay | Admitting: Family Medicine

## 2013-10-11 ENCOUNTER — Encounter (HOSPITAL_COMMUNITY): Payer: Self-pay | Admitting: Emergency Medicine

## 2013-10-11 ENCOUNTER — Emergency Department (INDEPENDENT_AMBULATORY_CARE_PROVIDER_SITE_OTHER)
Admission: EM | Admit: 2013-10-11 | Discharge: 2013-10-11 | Disposition: A | Discharge status: Home / Self Care | Payer: 59 | Source: Home / Self Care

## 2013-10-11 DIAGNOSIS — R0789 Other chest pain: Secondary | ICD-10-CM

## 2013-10-11 DIAGNOSIS — M940 Chondrocostal junction syndrome [Tietze]: Secondary | ICD-10-CM

## 2013-10-11 DIAGNOSIS — R071 Chest pain on breathing: Secondary | ICD-10-CM

## 2013-10-11 NOTE — Discharge Instructions (Signed)
Chest Wall Pain  Chest wall pain is pain felt in or around the chest bones and muscles. It may take up to 6 weeks to get better. It may take longer if you are active. Chest wall pain can happen on its own. Other times, things like germs, injury, coughing, or exercise can cause the pain.  HOME CARE   · Avoid activities that make you tired or cause pain. Try not to use your chest, belly (abdominal), or side muscles. Do not use heavy weights.  · Put ice on the sore area.  ¨ Put ice in a plastic bag.  ¨ Place a towel between your skin and the bag.  ¨ Leave the ice on for 15-20 minutes for the first 2 days.  · Only take medicine as told by your doctor.  GET HELP RIGHT AWAY IF:   · You have more pain or are very uncomfortable.  · You have a fever.  · Your chest pain gets worse.  · You have new problems.  · You feel sick to your stomach (nauseous) or throw up (vomit).  · You start to sweat or feel lightheaded.  · You have a cough with mucus (phlegm).  · You cough up blood.  MAKE SURE YOU:   · Understand these instructions.  · Will watch your condition.  · Will get help right away if you are not doing well or get worse.  Document Released: 09/01/2007 Document Revised: 06/07/2011 Document Reviewed: 11/09/2010  ExitCare® Patient Information ©2015 [employer]. This information is not intended to replace advice given to you by your health care provider. Make sure you discuss any questions you have with your health care provider.  Costochondritis  Costochondritis is a condition in which the tissue (cartilage) that connects your ribs with your breastbone (sternum) becomes irritated. It causes pain in the chest and rib area. It usually goes away on its own over time.  HOME CARE  · Avoid activities that wear you out.  · Do not strain your ribs. Avoid activities that use your:  ¨ Chest.  ¨ Belly.  ¨ Side muscles.  · Put ice on the area for the first 2 days after the pain starts.  ¨ Put ice in a plastic bag.  ¨ Place a towel  between your skin and the bag.  ¨ Leave the ice on for 20 minutes, 2-3 times a day.  · Only take medicine as told by your doctor.  GET HELP IF:  · You have redness or puffiness (swelling) in the rib area.  · Your pain does not go away with rest or medicine.  GET HELP RIGHT AWAY IF:   · Your pain gets worse.  · You are very uncomfortable.  · You have trouble breathing.  · You cough up blood.  · You start sweating or throwing up (vomiting).  · You have a fever or lasting symptoms for more than 2-3 days.  · You have a fever and your symptoms suddenly get worse.  MAKE SURE YOU:   · Understand these instructions.  · Will watch your condition.  · Will get help right away if you are not doing well or get worse.  Document Released: 09/01/2007 Document Revised: 11/15/2012 Document Reviewed: 10/17/2012  ExitCare® Patient Information ©2015 [employer]. This information is not intended to replace advice given to you by your health care provider. Make sure you discuss any questions you have with your health care provider.

## 2013-10-11 NOTE — ED Provider Notes (Signed)
CSN: [id number]     Arrival date & time 10/11/13  1546 History   First MD Initiated Contact with Patient 10/11/13 1629     Chief Complaint  Patient presents with  . Chest Pain   (Consider location/radiation/quality/duration/timing/severity/associated sxs/prior Treatment) HPI Comments: L anterior chest pain for 2-3 weeks.    Past Medical History  Diagnosis Date  . Ovarian cyst    History reviewed. No pertinent past surgical history. History reviewed. No pertinent family history. History  Substance Use Topics  . Smoking status: Current Every Day Smoker  . Smokeless tobacco: Not on file  . Alcohol Use: Yes   OB History   Grav Para Term Preterm Abortions TAB SAB Ect Mult Living                 Review of Systems  Constitutional: Negative.   Respiratory: Negative for cough, shortness of breath and wheezing.   Cardiovascular: Positive for chest pain. Negative for palpitations and leg swelling.  Skin: Negative.   Neurological: Positive for light-headedness.       This am  Psychiatric/Behavioral: The patient is nervous/anxious.   All other systems reviewed and are negative.   Allergies  Tramadol  Home Medications   Prior to Admission medications   Not on File   BP 115/76  Pulse 68  Temp(Src) 99.1 F (37.3 C) (Oral)  Resp 14  SpO2 100%  LMP 09/26/2013 Physical Exam  Nursing note and vitals reviewed. Constitutional: She is oriented to person, place, and time. She appears well-developed and well-nourished. No distress.  Neck: Normal range of motion. Neck supple.  Cardiovascular: Normal rate, regular rhythm, normal heart sounds and intact distal pulses.   No murmur heard. Pulmonary/Chest: Effort normal and breath sounds normal. She has no wheezes. She has no rales.  Reproducible anterior chest tenderness to the L parasternal and L upper chest wall. Same pain is elicited which prompts her visit for eval.  Musculoskeletal: Normal range of motion. She exhibits no edema.   Neurological: She is alert and oriented to person, place, and time.  Skin: Skin is warm and dry.  Psychiatric: Her mood appears anxious.    ED Course  Procedures (including critical care time) Labs Review Labs Reviewed - No data to display  Imaging Review No results found. EKG: NSR, no ectopy or ischemic changes  MDM   1. Chest wall pain   2. Costochondritis    Ice packs to chest Ibuprofen or aleve reassurance    Hayden Rasmussenavid Hafsah Hendler, NP 10/11/13 1646

## 2013-10-11 NOTE — ED Notes (Signed)
C/o chest pain radiating to left arm States pain has been on and off  [place] And was dx with anxiety

## 2013-10-12 NOTE — ED Provider Notes (Signed)
Medical screening examination/treatment/procedure(s) were performed by non-physician practitioner and as supervising physician I was immediately available for consultation/collaboration.  Leslee Homeavid Analilia Geddis, M.D.  Reuben Likesavid C Brayleigh Rybacki, MD 10/12/13 [phone]

## 2014-11-25 ENCOUNTER — Emergency Department (HOSPITAL_COMMUNITY)
Admission: EM | Admit: 2014-11-25 | Discharge: 2014-11-25 | Disposition: A | Discharge status: Home / Self Care | Payer: 59 | Attending: Emergency Medicine | Admitting: Emergency Medicine

## 2014-11-25 ENCOUNTER — Encounter (HOSPITAL_COMMUNITY): Payer: Self-pay | Admitting: Emergency Medicine

## 2014-11-25 DIAGNOSIS — Z79899 Other long term (current) drug therapy: Secondary | ICD-10-CM | POA: Diagnosis not present

## 2014-11-25 DIAGNOSIS — Z720 Tobacco use: Secondary | ICD-10-CM | POA: Diagnosis not present

## 2014-11-25 DIAGNOSIS — N9489 Other specified conditions associated with female genital organs and menstrual cycle: Secondary | ICD-10-CM | POA: Insufficient documentation

## 2014-11-25 DIAGNOSIS — N946 Dysmenorrhea, unspecified: Secondary | ICD-10-CM

## 2014-11-25 DIAGNOSIS — R109 Unspecified abdominal pain: Secondary | ICD-10-CM | POA: Diagnosis present

## 2014-11-25 LAB — URINALYSIS, ROUTINE W REFLEX MICROSCOPIC
Bilirubin Urine: NEGATIVE
GLUCOSE, UA: NEGATIVE mg/dL
Ketones, ur: NEGATIVE mg/dL
Leukocytes, UA: NEGATIVE
Nitrite: NEGATIVE
PH: 5.5 (ref 5.0–8.0)
PROTEIN: NEGATIVE mg/dL
SPECIFIC GRAVITY, URINE: 1.023 (ref 1.005–1.030)
Urobilinogen, UA: 0.2 mg/dL (ref 0.0–1.0)

## 2014-11-25 LAB — CBC
HCT: 36.2 % (ref 36.0–46.0)
HEMOGLOBIN: 11.9 g/dL — AB (ref 12.0–15.0)
MCH: 30.1 pg (ref 26.0–34.0)
MCHC: 32.9 g/dL (ref 30.0–36.0)
MCV: 91.4 fL (ref 78.0–100.0)
Platelets: 230 10*3/uL (ref 150–400)
RBC: 3.96 MIL/uL (ref 3.87–5.11)
RDW: 13.5 % (ref 11.5–15.5)
WBC: 8.3 10*3/uL (ref 4.0–10.5)

## 2014-11-25 LAB — URINE MICROSCOPIC-ADD ON

## 2014-11-25 MED ORDER — ONDANSETRON HCL 4 MG/2ML IJ SOLN
4.0000 mg | Freq: Once | INTRAMUSCULAR | Status: AC
  Administered 2014-11-25: 4 mg via INTRAVENOUS
  Filled 2014-11-25: qty 2

## 2014-11-25 MED ORDER — SODIUM CHLORIDE 0.9 % IV BOLUS (SEPSIS)
1000.0000 mL | Freq: Once | INTRAVENOUS | Status: AC
  Administered 2014-11-25: 1000 mL via INTRAVENOUS

## 2014-11-25 MED ORDER — KETOROLAC TROMETHAMINE 30 MG/ML IJ SOLN
30.0000 mg | Freq: Once | INTRAMUSCULAR | Status: AC
  Administered 2014-11-25: 30 mg via INTRAVENOUS
  Filled 2014-11-25: qty 1

## 2014-11-25 MED ORDER — LORAZEPAM 2 MG/ML IJ SOLN
0.5000 mg | Freq: Once | INTRAMUSCULAR | Status: AC
  Administered 2014-11-25: 0.5 mg via INTRAVENOUS
  Filled 2014-11-25: qty 1

## 2014-11-25 MED ORDER — SODIUM CHLORIDE 0.9 % IV SOLN
INTRAVENOUS | Status: DC
  Administered 2014-11-25: 13:00:00 via INTRAVENOUS

## 2014-11-25 MED ORDER — NAPROXEN 500 MG PO TABS: 500.0000 mg | ORAL_TABLET | Freq: Two times a day (BID) | ORAL | Status: DC

## 2014-11-25 NOTE — Discharge Instructions (Signed)

## 2014-11-25 NOTE — ED Notes (Signed)
Pt A+Ox4, reports c/o lower abd cramping pain, hx of ovarian cysts and feels the same.  Onset menstrual period last night.  Vomited x3 this AM.  Pt denies fevers/chills or other complaints.  Skin PWD.  MAEI.  Ambulatory with steady gait.  Speaking full/clear sentences, rr even/un-lab.  NAD.

## 2014-11-25 NOTE — ED Provider Notes (Signed)
CSN: [id number]     Arrival date & time 11/25/14  1112 History   First MD Initiated Contact with Patient 11/25/14 1139     Chief Complaint  Patient presents with  . Abdominal Pain    crampy lower abd pain, "menstrual cramps", hx of ovarian cysts  . Emesis    x3 episodes this AM "a lot, happens every time"     (Consider location/radiation/quality/duration/timing/severity/associated sxs/prior Treatment) HPI Comments: Patient here complaining of uterine cramping consistent with the beginning of her menstrual cycles. Patient states that she has monthly severe cramping with her menstrual periods. Does note that she is passing clots at this time but this is normal for her. Denies any chest or patency. She's had emesis 3 due to the pain. Denies any fever or chills. Denies any urinary symptoms. Symptoms persistent and nothing makes them better. No treatment used prior to arrival  Patient is a 24 y.o. female presenting with abdominal pain and vomiting. The history is provided by the patient.  Abdominal Pain Associated symptoms: vomiting   Emesis Associated symptoms: abdominal pain     Past Medical History  Diagnosis Date  . Ovarian cyst    History reviewed. No pertinent past surgical history. No family history on file. Social History  Substance Use Topics  . Smoking status: Current Every Day Smoker  . Smokeless tobacco: None  . Alcohol Use: Yes   OB History    No data available     Review of Systems  Gastrointestinal: Positive for vomiting and abdominal pain.  All other systems reviewed and are negative.     Allergies  Tramadol and Tylenol  Home Medications   Prior to Admission medications   Medication Sig Start Date End Date Taking? Authorizing Provider  ibuprofen (ADVIL,MOTRIN) 200 MG tablet Take 800 mg by mouth every 4 (four) hours as needed for headache or mild pain.   Yes Historical Provider, MD  Probiotic Product (PROBIOTIC DAILY PO) Take 2 capsules by mouth daily.    Yes Historical Provider, MD   BP 111/66 mmHg  Pulse 105  Temp(Src) 98.3 F (36.8 C) (Oral)  Resp 20  Ht 6' (1.829 m)  Wt 143 lb (64.864 kg)  BMI 19.39 kg/m2  SpO2 100%  LMP 11/24/2014 Physical Exam  Constitutional: She is oriented to person, place, and time. She appears well-developed and well-nourished.  Non-toxic appearance. No distress.  HENT:  Head: Normocephalic and atraumatic.  Eyes: Conjunctivae, EOM and lids are normal. Pupils are equal, round, and reactive to light.  Neck: Normal range of motion. Neck supple. No tracheal deviation present. No thyroid mass present.  Cardiovascular: Normal rate, regular rhythm and normal heart sounds.  Exam reveals no gallop.   No murmur heard. Pulmonary/Chest: Effort normal and breath sounds normal. No stridor. No respiratory distress. She has no decreased breath sounds. She has no wheezes. She has no rhonchi. She has no rales.  Abdominal: Soft. Normal appearance and bowel sounds are normal. She exhibits no distension. There is no tenderness. There is no rebound and no CVA tenderness.  Musculoskeletal: Normal range of motion. She exhibits no edema or tenderness.  Neurological: She is alert and oriented to person, place, and time. She has normal strength. No cranial nerve deficit or sensory deficit. GCS eye subscore is 4. GCS verbal subscore is 5. GCS motor subscore is 6.  Skin: Skin is warm and dry. No abrasion and no rash noted.  Psychiatric: She has a normal mood and affect. Her speech is normal  and behavior is normal.  Nursing note and vitals reviewed.   ED Course  Procedures (including critical care time) Labs Review Labs Reviewed  CBC  URINALYSIS, ROUTINE W REFLEX MICROSCOPIC (NOT AT [facility])  I-STAT BETA HCG BLOOD, ED ([facility], [facility], [facility] ONLY)    Imaging Review No results found. I have personally reviewed and evaluated these images and lab results as part of my medical decision-making.   EKG Interpretation None      MDM   Final  diagnoses:  None    Patient given pain meds and feels better. Patient stable for discharge    Lorre Nick, MD 11/25/14 1530

## 2014-11-25 NOTE — ED Notes (Signed)
Discussed D/C instructions and medications with Pt.  Pt verbalized understanding.  Pt going home with sister.

## 2015-03-11 ENCOUNTER — Ambulatory Visit (HOSPITAL_COMMUNITY): Payer: 59 | Admitting: Psychiatry

## 2015-11-03 ENCOUNTER — Emergency Department (HOSPITAL_COMMUNITY): Payer: Self-pay

## 2015-11-03 ENCOUNTER — Encounter (HOSPITAL_COMMUNITY): Payer: Self-pay | Admitting: Vascular Surgery

## 2015-11-03 ENCOUNTER — Emergency Department (HOSPITAL_COMMUNITY)
Admission: EM | Admit: 2015-11-03 | Discharge: 2015-11-03 | Disposition: A | Discharge status: Home / Self Care | Payer: Self-pay | Attending: Dermatology | Admitting: Dermatology

## 2015-11-03 DIAGNOSIS — R0789 Other chest pain: Secondary | ICD-10-CM | POA: Insufficient documentation

## 2015-11-03 DIAGNOSIS — Z87891 Personal history of nicotine dependence: Secondary | ICD-10-CM | POA: Insufficient documentation

## 2015-11-03 DIAGNOSIS — Z5321 Procedure and treatment not carried out due to patient leaving prior to being seen by health care provider: Secondary | ICD-10-CM | POA: Insufficient documentation

## 2015-11-03 LAB — CBC
HCT: 34.2 % — ABNORMAL LOW (ref 36.0–46.0)
Hemoglobin: 11.2 g/dL — ABNORMAL LOW (ref 12.0–15.0)
MCH: 30.5 pg (ref 26.0–34.0)
MCHC: 32.7 g/dL (ref 30.0–36.0)
MCV: 93.2 fL (ref 78.0–100.0)
PLATELETS: 260 10*3/uL (ref 150–400)
RBC: 3.67 MIL/uL — ABNORMAL LOW (ref 3.87–5.11)
RDW: 13.6 % (ref 11.5–15.5)
WBC: 7.5 10*3/uL (ref 4.0–10.5)

## 2015-11-03 LAB — I-STAT TROPONIN, ED: TROPONIN I, POC: 0 ng/mL (ref 0.00–0.08)

## 2015-11-03 LAB — BASIC METABOLIC PANEL
Anion gap: 5 (ref 5–15)
BUN: 7 mg/dL (ref 6–20)
CHLORIDE: 107 mmol/L (ref 101–111)
CO2: 26 mmol/L (ref 22–32)
CREATININE: 0.67 mg/dL (ref 0.44–1.00)
Calcium: 9.4 mg/dL (ref 8.9–10.3)
Glucose, Bld: 91 mg/dL (ref 65–99)
POTASSIUM: 3.5 mmol/L (ref 3.5–5.1)
SODIUM: 138 mmol/L (ref 135–145)

## 2015-11-03 NOTE — ED Notes (Signed)
Pt to desk to ask how much longer she will be waiting. Pt informed that wait times are constantly changing. Pt responded "OK' then walks outside and proceeds to her vehicle.

## 2015-11-03 NOTE — ED Triage Notes (Signed)
Pt reports to the ED for eval of mid/left sided chest pain and tightness x several days. Reports associated symptoms of lightheadedness. Denies any aggravating or relieving factors. Pt A&Ox4, resp e/u, and skin warm and dry.

## 2015-11-04 ENCOUNTER — Encounter: Payer: Self-pay | Admitting: Emergency Medicine

## 2015-11-04 ENCOUNTER — Emergency Department
Admission: EM | Admit: 2015-11-04 | Discharge: 2015-11-04 | Disposition: A | Discharge status: Home / Self Care | Payer: 59 | Attending: Emergency Medicine | Admitting: Emergency Medicine

## 2015-11-04 ENCOUNTER — Emergency Department: Payer: 59

## 2015-11-04 DIAGNOSIS — Y929 Unspecified place or not applicable: Secondary | ICD-10-CM | POA: Diagnosis not present

## 2015-11-04 DIAGNOSIS — Z87891 Personal history of nicotine dependence: Secondary | ICD-10-CM | POA: Diagnosis not present

## 2015-11-04 DIAGNOSIS — Y939 Activity, unspecified: Secondary | ICD-10-CM | POA: Diagnosis not present

## 2015-11-04 DIAGNOSIS — S0990XA Unspecified injury of head, initial encounter: Secondary | ICD-10-CM

## 2015-11-04 DIAGNOSIS — Y999 Unspecified external cause status: Secondary | ICD-10-CM | POA: Insufficient documentation

## 2015-11-04 NOTE — ED Notes (Signed)
Patient transported to CT 
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
## 2015-11-04 NOTE — Discharge Instructions (Signed)
Return to the ER for worsening symptoms, persistent vomiting, lethargy or other concerns. °
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
## 2016-01-21 ENCOUNTER — Emergency Department (HOSPITAL_COMMUNITY): Payer: 59

## 2016-01-21 ENCOUNTER — Encounter (HOSPITAL_COMMUNITY): Payer: Self-pay | Admitting: *Deleted

## 2016-01-21 ENCOUNTER — Emergency Department (HOSPITAL_COMMUNITY)
Admission: EM | Admit: 2016-01-21 | Discharge: 2016-01-21 | Disposition: A | Discharge status: Home / Self Care | Payer: 59 | Attending: Emergency Medicine | Admitting: Emergency Medicine

## 2016-01-21 DIAGNOSIS — Z87891 Personal history of nicotine dependence: Secondary | ICD-10-CM | POA: Diagnosis not present

## 2016-01-21 DIAGNOSIS — Y999 Unspecified external cause status: Secondary | ICD-10-CM | POA: Insufficient documentation

## 2016-01-21 DIAGNOSIS — R079 Chest pain, unspecified: Secondary | ICD-10-CM

## 2016-01-21 DIAGNOSIS — F1290 Cannabis use, unspecified, uncomplicated: Secondary | ICD-10-CM | POA: Insufficient documentation

## 2016-01-21 DIAGNOSIS — Y9389 Activity, other specified: Secondary | ICD-10-CM | POA: Diagnosis not present

## 2016-01-21 DIAGNOSIS — R42 Dizziness and giddiness: Secondary | ICD-10-CM

## 2016-01-21 DIAGNOSIS — Y92488 Other paved roadways as the place of occurrence of the external cause: Secondary | ICD-10-CM | POA: Diagnosis not present

## 2016-01-21 LAB — CBC
HCT: 33.7 % — ABNORMAL LOW (ref 36.0–46.0)
HEMOGLOBIN: 11.3 g/dL — AB (ref 12.0–15.0)
MCH: 30.1 pg (ref 26.0–34.0)
MCHC: 33.5 g/dL (ref 30.0–36.0)
MCV: 89.9 fL (ref 78.0–100.0)
PLATELETS: 268 10*3/uL (ref 150–400)
RBC: 3.75 MIL/uL — ABNORMAL LOW (ref 3.87–5.11)
RDW: 13.4 % (ref 11.5–15.5)
WBC: 5.9 10*3/uL (ref 4.0–10.5)

## 2016-01-21 LAB — BASIC METABOLIC PANEL
ANION GAP: 8 (ref 5–15)
BUN: 9 mg/dL (ref 6–20)
CALCIUM: 9.5 mg/dL (ref 8.9–10.3)
CO2: 23 mmol/L (ref 22–32)
CREATININE: 0.66 mg/dL (ref 0.44–1.00)
Chloride: 105 mmol/L (ref 101–111)
GLUCOSE: 93 mg/dL (ref 65–99)
Potassium: 3.6 mmol/L (ref 3.5–5.1)
Sodium: 136 mmol/L (ref 135–145)

## 2016-01-21 LAB — URINALYSIS, ROUTINE W REFLEX MICROSCOPIC
BILIRUBIN URINE: NEGATIVE
Glucose, UA: NEGATIVE mg/dL
HGB URINE DIPSTICK: NEGATIVE
KETONES UR: NEGATIVE mg/dL
NITRITE: NEGATIVE
PROTEIN: NEGATIVE mg/dL
SPECIFIC GRAVITY, URINE: 1.022 (ref 1.005–1.030)
pH: 6.5 (ref 5.0–8.0)

## 2016-01-21 LAB — TSH: TSH: 0.801 u[IU]/mL (ref 0.350–4.500)

## 2016-01-21 LAB — URINE MICROSCOPIC-ADD ON

## 2016-01-21 LAB — PREGNANCY, URINE: Preg Test, Ur: NEGATIVE

## 2016-01-21 MED ORDER — SODIUM CHLORIDE 0.9 % IV BOLUS (SEPSIS)
1000.0000 mL | Freq: Once | INTRAVENOUS | Status: AC
  Administered 2016-01-21: 1000 mL via INTRAVENOUS

## 2016-01-21 NOTE — ED Notes (Signed)
Encouraged patient to get urine sample. Patient is currently unable to urinate at this time.

## 2016-01-21 NOTE — ED Notes (Signed)
Patient transported to CT 
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
## 2016-01-21 NOTE — Discharge Instructions (Signed)
Treatment: Make sure to drink plenty of water, at least a glass of water daily. Make sure to eat 3 meals a day, including protein.  Follow-up: Please follow-up with your primary care provider for further evaluation and treatment of your symptoms. Please return to emergency department if you develop any new or worsening symptoms.

## 2016-01-21 NOTE — ED Notes (Signed)
Patient ambulated to the hallway bathroom without difficulty.

## 2016-01-21 NOTE — ED Notes (Signed)
Patient aware of urine sample and encouraged to void when able.

## 2016-01-21 NOTE — ED Notes (Signed)
Pt states she is unable to use the restroom at this time 
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
## 2016-03-10 ENCOUNTER — Emergency Department
Admission: EM | Admit: 2016-03-10 | Discharge: 2016-03-10 | Disposition: A | Discharge status: Home / Self Care | Payer: 59 | Attending: Emergency Medicine | Admitting: Emergency Medicine

## 2016-03-10 ENCOUNTER — Encounter: Payer: Self-pay | Admitting: Emergency Medicine

## 2016-03-10 DIAGNOSIS — F1290 Cannabis use, unspecified, uncomplicated: Secondary | ICD-10-CM | POA: Diagnosis not present

## 2016-03-10 DIAGNOSIS — R1030 Lower abdominal pain, unspecified: Secondary | ICD-10-CM | POA: Diagnosis not present

## 2016-03-10 DIAGNOSIS — Z87891 Personal history of nicotine dependence: Secondary | ICD-10-CM | POA: Insufficient documentation

## 2016-03-10 DIAGNOSIS — R1032 Left lower quadrant pain: Secondary | ICD-10-CM | POA: Diagnosis present

## 2016-03-10 LAB — URINALYSIS, COMPLETE (UACMP) WITH MICROSCOPIC
BACTERIA UA: NONE SEEN
Bilirubin Urine: NEGATIVE
Glucose, UA: NEGATIVE mg/dL
HGB URINE DIPSTICK: NEGATIVE
Ketones, ur: 20 mg/dL — AB
Leukocytes, UA: NEGATIVE
NITRITE: NEGATIVE
PROTEIN: NEGATIVE mg/dL
SPECIFIC GRAVITY, URINE: 1.018 (ref 1.005–1.030)
pH: 6 (ref 5.0–8.0)

## 2016-03-10 LAB — COMPREHENSIVE METABOLIC PANEL
ALBUMIN: 5.2 g/dL — AB (ref 3.5–5.0)
ALK PHOS: 54 U/L (ref 38–126)
ALT: 16 U/L (ref 14–54)
AST: 25 U/L (ref 15–41)
Anion gap: 8 (ref 5–15)
BILIRUBIN TOTAL: 0.9 mg/dL (ref 0.3–1.2)
BUN: 13 mg/dL (ref 6–20)
CALCIUM: 10 mg/dL (ref 8.9–10.3)
CO2: 26 mmol/L (ref 22–32)
Chloride: 100 mmol/L — ABNORMAL LOW (ref 101–111)
Creatinine, Ser: 0.79 mg/dL (ref 0.44–1.00)
GFR calc Af Amer: >60 mL/min (ref >60)
GFR calc non Af Amer: >60 mL/min (ref >60)
GLUCOSE: 97 mg/dL (ref 65–99)
POTASSIUM: 3.6 mmol/L (ref 3.5–5.1)
Sodium: 134 mmol/L — ABNORMAL LOW (ref 135–145)
TOTAL PROTEIN: 9.5 g/dL — AB (ref 6.5–8.1)

## 2016-03-10 LAB — LIPASE, BLOOD: Lipase: 20 U/L (ref 11–51)

## 2016-03-10 LAB — CBC
HEMATOCRIT: 36.5 % (ref 35.0–47.0)
Hemoglobin: 12.2 g/dL (ref 12.0–16.0)
MCH: 30 pg (ref 26.0–34.0)
MCHC: 33.5 g/dL (ref 32.0–36.0)
MCV: 89.4 fL (ref 80.0–100.0)
Platelets: 284 10*3/uL (ref 150–440)
RBC: 4.08 MIL/uL (ref 3.80–5.20)
RDW: 14.6 % — AB (ref 11.5–14.5)
WBC: 7.4 10*3/uL (ref 3.6–11.0)

## 2016-03-10 LAB — POCT PREGNANCY, URINE: PREG TEST UR: NEGATIVE

## 2016-03-10 MED ORDER — NAPROXEN 500 MG PO TABS: 500.0000 mg | ORAL_TABLET | Freq: Two times a day (BID) | ORAL | 0 refills | Status: DC

## 2016-03-10 MED ORDER — ONDANSETRON 4 MG PO TBDP: 4.0000 mg | ORAL_TABLET | Freq: Three times a day (TID) | ORAL | 0 refills | Status: AC | PRN

## 2016-03-10 NOTE — ED Provider Notes (Signed)
[facility] Emergency Department Provider Note  ____________________________________________  Time seen: Approximately 9:47 PM  I have reviewed the triage vital signs and the nursing notes.   HISTORY  Chief Complaint Abdominal Pain    HPI Yolanda SpringsJocelyn C Wall is a 25 y.o. female who complains of left lower quadrant abdominal pain which is a recurrent issue for her, happening at least once a month. Reports it is usually worse during her period, but also happens in between periods sometimes. Her last period was about a week ago. Normal timing. Reports she has a history of ovarian cyst, last saw anybody about it a year ago. [place]. No aggravating or alleviating factors. Currently resolved.  Review of electronic medical record shows ultrasound in 2014 revealed a small left hemorrhagic follicle but no cyst   No vaginal bleeding or discharge.  Past Medical History:  Diagnosis Date  . Ovarian cyst      There are no active problems to display for this patient.    History reviewed. No pertinent surgical history.   Prior to Admission medications   Medication Sig Start Date End Date Taking? Authorizing Provider  naproxen (NAPROSYN) 500 MG tablet Take 1 tablet (500 mg total) by mouth 2 (two) times daily with a meal. 03/10/16   Sharman CheekPhillip Aleksa Catterton, MD  ondansetron (ZOFRAN ODT) 4 MG disintegrating tablet Take 1 tablet (4 mg total) by mouth every 8 (eight) hours as needed for nausea or vomiting. 03/10/16   Sharman CheekPhillip Anayla Giannetti, MD     Allergies Tramadol and Tylenol [acetaminophen]   History reviewed. No pertinent family history.  Social History Social History  Substance Use Topics  . Smoking status: Former Smoker    Types: Cigarettes  . Smokeless tobacco: Never Used  . Alcohol use Yes     Comment: socially    Review of Systems  Constitutional:   No fever or chills.  ENT:   No sore throat. No rhinorrhea. Cardiovascular:   No chest  pain. Respiratory:   No dyspnea or cough. Gastrointestinal:   Positive abdominal pain as above without vomiting or diarrhea.  Genitourinary:   Negative for dysuria , positive urinary frequency Musculoskeletal:   Negative for focal pain or swelling Neurological:   Negative for headaches 10-point ROS otherwise negative.  ____________________________________________   PHYSICAL EXAM:  VITAL SIGNS: ED Triage Vitals [03/10/16 1812]  Enc Vitals Group     BP 98/81     Pulse Rate 84     Resp 18     Temp 98.6 F (37 C)     Temp Source Oral     SpO2 100 %     Weight 134 lb (60.8 kg)     Height 6' (1.829 m)     Head Circumference      Peak Flow      Pain Score 10     Pain Loc      Pain Edu?      Excl. in GC?     Vital signs reviewed, nursing assessments reviewed.   Constitutional:   Alert and oriented. Well appearing and in no distress. Calm and comfortable, sleeping on the ED stretcher with family member  Eyes:   No scleral icterus. No conjunctival pallor. PERRL. EOMI.  No nystagmus. ENT   Head:   Normocephalic and atraumatic.   Nose:   No congestion/rhinnorhea. No septal hematoma   Mouth/Throat:   MMM, no pharyngeal erythema. No peritonsillar mass.    Neck:   No stridor.  No SubQ emphysema. No meningismus. Hematological/Lymphatic/Immunilogical:   No cervical lymphadenopathy. Cardiovascular:   RRR. Symmetric bilateral radial and DP pulses.  No murmurs.  Respiratory:   Normal respiratory effort without tachypnea nor retractions. Breath sounds are clear and equal bilaterally. No wheezes/rales/rhonchi. Gastrointestinal:   Soft with suprapubic tenderness. Non distended. There is no CVA tenderness.  No rebound, rigidity, or guarding. Genitourinary:   deferred Musculoskeletal:   Nontender with normal range of motion in all extremities. No joint effusions.  No lower extremity tenderness.  No edema. Neurologic:   Normal speech and language.  CN 2-10 normal. Motor grossly  intact. No gross focal neurologic deficits are appreciated.  Skin:    Skin is warm, dry and intact. No rash noted.  No petechiae, purpura, or bullae.  ____________________________________________    LABS (pertinent positives/negatives) (all labs ordered are listed, but only abnormal results are displayed) Labs Reviewed  COMPREHENSIVE METABOLIC PANEL - Abnormal; Notable for the following:       Result Value   Sodium 134 (*)    Chloride 100 (*)    Total Protein 9.5 (*)    Albumin 5.2 (*)    All other components within normal limits  CBC - Abnormal; Notable for the following:    RDW 14.6 (*)    All other components within normal limits  URINALYSIS, COMPLETE (UACMP) WITH MICROSCOPIC - Abnormal; Notable for the following:    Color, Urine YELLOW (*)    APPearance CLEAR (*)    Ketones, ur 20 (*)    Squamous Epithelial / LPF 0-5 (*)    All other components within normal limits  LIPASE, BLOOD  POC URINE PREG, ED  POCT PREGNANCY, URINE   ____________________________________________   EKG    ____________________________________________    RADIOLOGY    ____________________________________________   PROCEDURES Procedures  ____________________________________________   INITIAL IMPRESSION / ASSESSMENT AND PLAN / ED COURSE  Pertinent labs & imaging results that were available during my care of the patient were reviewed by me and considered in my medical decision making (see chart for details).  Patient well appearing no acute distress. Low suspicion for STD PID or torsion.Considering the patient's symptoms, medical history, and physical examination today, I have low suspicion for cholecystitis or biliary pathology, pancreatitis, perforation or bowel obstruction, hernia, intra-abdominal abscess, AAA or dissection, volvulus or intussusception, mesenteric ischemia, or appendicitis.  Awaiting urinalysis to evaluate for urinary tract infection. We will refer her to gynecology  follow-up afterward, plus or minus antibiotics.   ----------------------------------------- 10:45 PM on 03/10/2016 -----------------------------------------  Vital signs remained unremarkable. Urinalysis unremarkable, pregnancy negative. Patient advised to see her gynecologist. Remains calm and comfortable without pain medications in the ED. We'll discharge home.  Clinical Course    ____________________________________________   FINAL CLINICAL IMPRESSION(S) / ED DIAGNOSES  Final diagnoses:  Lower abdominal pain      New Prescriptions   NAPROXEN (NAPROSYN) 500 MG TABLET    Take 1 tablet (500 mg total) by mouth 2 (two) times daily with a meal.   ONDANSETRON (ZOFRAN ODT) 4 MG DISINTEGRATING TABLET    Take 1 tablet (4 mg total) by mouth every 8 (eight) hours as needed for nausea or vomiting.     Portions of this note were generated with dragon dictation software. Dictation errors may occur despite best attempts at proofreading.    Sharman CheekPhillip Dalilah Curlin, MD 03/10/16 [phone]

## 2016-03-10 NOTE — ED Notes (Signed)
ED Provider at bedside. 

## 2016-03-10 NOTE — Discharge Instructions (Signed)
Lab tests today were unremarkable. Please follow-up with your doctor for continued monitoring of these recurrent symptoms.

## 2016-03-10 NOTE — ED Triage Notes (Signed)
Pt reports that she has had ovarian cyst for 5 years and that every time she has her menstrual period she has severe pain in her llq. Pt has sought treatment from gynecologist but has not had resolution. Pt is tearful during triage.

## 2016-03-31 ENCOUNTER — Emergency Department
Admission: EM | Admit: 2016-03-31 | Discharge: 2016-03-31 | Disposition: A | Discharge status: Home / Self Care | Payer: 59 | Attending: Emergency Medicine | Admitting: Emergency Medicine

## 2016-03-31 ENCOUNTER — Encounter: Payer: Self-pay | Admitting: Emergency Medicine

## 2016-03-31 DIAGNOSIS — Y9289 Other specified places as the place of occurrence of the external cause: Secondary | ICD-10-CM | POA: Insufficient documentation

## 2016-03-31 DIAGNOSIS — Y990 Civilian activity done for income or pay: Secondary | ICD-10-CM | POA: Insufficient documentation

## 2016-03-31 DIAGNOSIS — W57XXXA Bitten or stung by nonvenomous insect and other nonvenomous arthropods, initial encounter: Secondary | ICD-10-CM | POA: Diagnosis not present

## 2016-03-31 DIAGNOSIS — Z79899 Other long term (current) drug therapy: Secondary | ICD-10-CM | POA: Insufficient documentation

## 2016-03-31 DIAGNOSIS — Z87891 Personal history of nicotine dependence: Secondary | ICD-10-CM | POA: Diagnosis not present

## 2016-03-31 DIAGNOSIS — L03113 Cellulitis of right upper limb: Secondary | ICD-10-CM

## 2016-03-31 DIAGNOSIS — Y9389 Activity, other specified: Secondary | ICD-10-CM | POA: Insufficient documentation

## 2016-03-31 DIAGNOSIS — S50862A Insect bite (nonvenomous) of left forearm, initial encounter: Secondary | ICD-10-CM | POA: Diagnosis present

## 2016-03-31 LAB — CBC WITH DIFFERENTIAL/PLATELET
BASOS ABS: 0 10*3/uL (ref 0–0.1)
BASOS PCT: 1 %
EOS PCT: 1 %
Eosinophils Absolute: 0.1 10*3/uL (ref 0–0.7)
HCT: 31.1 % — ABNORMAL LOW (ref 35.0–47.0)
Hemoglobin: 10.5 g/dL — ABNORMAL LOW (ref 12.0–16.0)
Lymphocytes Relative: 28 %
Lymphs Abs: 2.6 10*3/uL (ref 1.0–3.6)
MCH: 29.7 pg (ref 26.0–34.0)
MCHC: 33.7 g/dL (ref 32.0–36.0)
MCV: 87.9 fL (ref 80.0–100.0)
MONO ABS: 0.5 10*3/uL (ref 0.2–0.9)
Monocytes Relative: 6 %
Neutro Abs: 6.1 10*3/uL (ref 1.4–6.5)
Neutrophils Relative %: 64 %
PLATELETS: 299 10*3/uL (ref 150–440)
RBC: 3.54 MIL/uL — ABNORMAL LOW (ref 3.80–5.20)
RDW: 14.2 % (ref 11.5–14.5)
WBC: 9.3 10*3/uL (ref 3.6–11.0)

## 2016-03-31 MED ORDER — CLINDAMYCIN PHOSPHATE 600 MG/50ML IV SOLN
600.0000 mg | Freq: Once | INTRAVENOUS | Status: AC
  Administered 2016-03-31: 600 mg via INTRAVENOUS
  Filled 2016-03-31: qty 50

## 2016-03-31 MED ORDER — DIPHENHYDRAMINE HCL 50 MG/ML IJ SOLN
25.0000 mg | Freq: Once | INTRAMUSCULAR | Status: AC
  Administered 2016-03-31: 25 mg via INTRAVENOUS
  Filled 2016-03-31: qty 1

## 2016-03-31 MED ORDER — CLINDAMYCIN HCL 300 MG PO CAPS: 300.0000 mg | ORAL_CAPSULE | Freq: Three times a day (TID) | ORAL | 0 refills | Status: AC

## 2016-03-31 NOTE — ED Provider Notes (Signed)
[facility] Emergency Department Provider Note ____________________________________________  Time seen: 2205  I have reviewed the triage vital signs and the nursing notes.  HISTORY  Chief Complaint  Insect Bite and Cellulitis  HPI Yolanda Wall is a 26 y.o. female presents to the ED for evaluation of an insect bite to her right wrist. 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 She reports she did not actually see the insect or spider, but thought that she had been bitten by a mosquito. She describes noting itching to the left wrist, that increased during shift by this morning, she noted swelling and increased itching to his lower wrist. She'll status some clear drainage from the bite site. She has noticed some streaking up the dorsal forearm, and is aware of a tender lymph node in the medial elbow. She denies any fevers, chills, sweats.  Past Medical History:  Diagnosis Date  . Ovarian cyst     There are no active problems to display for this patient.   History reviewed. No pertinent surgical history.  Prior to Admission medications   Medication Sig Start Date End Date Taking? Authorizing Provider  naproxen (NAPROSYN) 500 MG tablet Take 1 tablet (500 mg total) by mouth 2 (two) times daily with a meal. 03/10/16   Sharman Cheek, MD  ondansetron (ZOFRAN ODT) 4 MG disintegrating tablet Take 1 tablet (4 mg total) by mouth every 8 (eight) hours as needed for nausea or vomiting. 03/10/16   Sharman Cheek, MD    Allergies Tramadol and Tylenol [acetaminophen]  No family history on file.  Social History Social History  Substance Use Topics  . Smoking status: Former Smoker    Types: Cigarettes  . Smokeless tobacco: Never Used  . Alcohol use Yes     Comment: socially    Review of Systems  Constitutional: Negative for fever. Cardiovascular: Negative for chest pain. Respiratory: Negative  for shortness of breath. Gastrointestinal: Negative for abdominal pain, vomiting and diarrhea. Skin: Negative for rash. Skin swelling and redness as above. Neurological: Negative for headaches, focal weakness or numbness. ____________________________________________  PHYSICAL EXAM:  VITAL SIGNS: ED Triage Vitals [03/31/16 2007]  Enc Vitals Group     BP 119/81     Pulse Rate 72     Resp 18     Temp 97.8 F (36.6 C)     Temp Source Oral     SpO2 100 %     Weight 130 lb (59 kg)     Height 6' (1.829 m)     Head Circumference      Peak Flow      Pain Score 0     Pain Loc      Pain Edu?      Excl. in GC?    Constitutional: Alert and oriented. Well appearing and in no distress. Head: Normocephalic and atraumatic. Hematological/Lymphatic/Immunological: Palpable right epitrochlear lymphadenopathy. Cardiovascular: Normal rate, regular rhythm. Normal distal pulses. Respiratory: Normal respiratory effort. No wheezes/rales/rhonchi. Musculoskeletal: Nontender with normal range of motion in all extremities.  Neurologic:  Normal gait without ataxia. Normal speech and language. No gross focal neurologic deficits are appreciated. Skin:  Skin is warm, dry and intact. Right volar wrist the 2 distinct punctures about 3 cm apart. Local erythema, induration noted. Some lymphangitis noted progressing proximally from the volar wrist. Scant, clear sanguinous drainage noted.  ____________________________________________   LABS (pertinent positives/negatives) Labs Reviewed  CBC WITH DIFFERENTIAL/PLATELET - Abnormal;  Notable for the following:       Result Value   RBC 3.54 (*)    Hemoglobin 10.5 (*)    HCT 31.1 (*)    All other components within normal limits  ____________________________________________  PROCEDURES  Benadryl 25 mg IV Clindamycin 600 mg IVP ____________________________________________  INITIAL IMPRESSION / ASSESSMENT AND PLAN / ED COURSE  Patient with a left forearm insect  bite with local reaction and early cellulitis on presentation. She is with normal labs at this time and is treated empirically with an IV dose of antibiotics in the ED. She'll be discharged with a prescription for clindamycin to dose as directed. She will follow up with her primary care provider for wound check and reevaluation in the interim. She is advised to keep the wounds clean, covered, and dry. She may dose over-the-counter Benadryl as needed for additional relief. Return precautions are reviewed. Work note for 2 days is provided at her request.  Clinical Course    ____________________________________________  FINAL CLINICAL IMPRESSION(S) / ED DIAGNOSES  Final diagnoses:  Bug bite with infection, initial encounter  Cellulitis of right upper extremity      Lissa HoardJenise V Bacon Mazie Fencl, PA-C 03/31/16 2253    Rockne MenghiniAnne-Caroline Norman, MD 03/31/16 2327

## 2016-03-31 NOTE — ED Triage Notes (Signed)
Patient states that she was bit by a bug last night and now has swollen area and red streak to left lower arm.

## 2016-03-31 NOTE — Discharge Instructions (Signed)
Take the antibiotic as directed until all pills are gone. Apply warm compresses to promote healing. Keep the wound clean, dry, and covered. Follow-up with your provider for continued symptoms and wound check as needed.

## 2016-03-31 NOTE — ED Notes (Signed)
Pt discharged to home.  Family member driving.  Discharge instructions reviewed.  Verbalized understanding.  No questions or concerns at this time.  Teach back verified.  Pt in NAD.  No items left in ED.   

## 2016-04-09 ENCOUNTER — Emergency Department: Payer: 59

## 2016-04-09 ENCOUNTER — Encounter: Payer: Self-pay | Admitting: Emergency Medicine

## 2016-04-09 ENCOUNTER — Emergency Department
Admission: EM | Admit: 2016-04-09 | Discharge: 2016-04-09 | Disposition: A | Discharge status: Home / Self Care | Payer: 59 | Attending: Emergency Medicine | Admitting: Emergency Medicine

## 2016-04-09 DIAGNOSIS — R0789 Other chest pain: Secondary | ICD-10-CM | POA: Diagnosis present

## 2016-04-09 DIAGNOSIS — G44209 Tension-type headache, unspecified, not intractable: Secondary | ICD-10-CM | POA: Diagnosis not present

## 2016-04-09 DIAGNOSIS — Z87891 Personal history of nicotine dependence: Secondary | ICD-10-CM | POA: Insufficient documentation

## 2016-04-09 LAB — TROPONIN I: Troponin I: <0.03 ng/mL (ref <0.03)

## 2016-04-09 LAB — CBC
HCT: 32.4 % — ABNORMAL LOW (ref 35.0–47.0)
Hemoglobin: 10.9 g/dL — ABNORMAL LOW (ref 12.0–16.0)
MCH: 29.6 pg (ref 26.0–34.0)
MCHC: 33.8 g/dL (ref 32.0–36.0)
MCV: 87.8 fL (ref 80.0–100.0)
Platelets: 279 10*3/uL (ref 150–440)
RBC: 3.69 MIL/uL — ABNORMAL LOW (ref 3.80–5.20)
RDW: 14.3 % (ref 11.5–14.5)
WBC: 8.1 10*3/uL (ref 3.6–11.0)

## 2016-04-09 LAB — BASIC METABOLIC PANEL
Anion gap: 6 (ref 5–15)
BUN: 12 mg/dL (ref 6–20)
CO2: 25 mmol/L (ref 22–32)
CREATININE: 0.81 mg/dL (ref 0.44–1.00)
Calcium: 9.4 mg/dL (ref 8.9–10.3)
Chloride: 104 mmol/L (ref 101–111)
GFR calc Af Amer: >60 mL/min (ref >60)
GLUCOSE: 107 mg/dL — AB (ref 65–99)
POTASSIUM: 4 mmol/L (ref 3.5–5.1)
Sodium: 135 mmol/L (ref 135–145)

## 2016-04-09 MED ORDER — IBUPROFEN 800 MG PO TABS: 800.0000 mg | ORAL_TABLET | Freq: Three times a day (TID) | ORAL | 0 refills | Status: DC | PRN

## 2016-04-09 MED ORDER — ORPHENADRINE CITRATE 30 MG/ML IJ SOLN
60.0000 mg | Freq: Once | INTRAMUSCULAR | Status: AC
  Administered 2016-04-09: 60 mg via INTRAMUSCULAR
  Filled 2016-04-09: qty 2

## 2016-04-09 MED ORDER — KETOROLAC TROMETHAMINE 30 MG/ML IJ SOLN
30.0000 mg | Freq: Once | INTRAMUSCULAR | Status: AC
  Administered 2016-04-09: 30 mg via INTRAMUSCULAR
  Filled 2016-04-09: qty 1

## 2016-04-09 NOTE — ED Provider Notes (Signed)
[facility]-EMERGENCY DEPARTMENT Provider Note   CSN: [id number] Arrival date & time: 04/09/16  1824     History   Chief Complaint Chief Complaint  Patient presents with  . Chest Pain    HPI Yolanda Wall is a 26 y.o. female presents to the emergency department for evaluation of chest pain, headache, lightheadedness. Patient states all symptoms began today, she developed midsternal chest pain that is tender to touch and taking a deep breath. Pain is sharp. Pain is not increased with activity. She denies any coughing. No shortness of breath/wheezing. Patient has not taking any medications such as Tylenol or ibuprofen for this pain. She denies any trauma or injury. Pain is currently 1 out of 10 but is increased to 5 out of 10 with touch. Patient also complains of headache, she states this is her normal headache that has not resolved and lasted longer than normal. Her headache began today. No nausea vomiting or vision changes. She describes tightness and tension along the posterior neck muscles and to the base of the skull. No numbness or tingling in the upper extremities. She is not taking any medications for her headache. Patient felt lightheadedness earlier today, currently denies any lightheadedness dizziness or vertigo symptoms. She is tolerating fluids well.  HPI  Past Medical History:  Diagnosis Date  . Ovarian cyst     There are no active problems to display for this patient.   No past surgical history on file.  OB History    No data available       Home Medications    Prior to Admission medications   Medication Sig Start Date End Date Taking? Authorizing Provider  clindamycin (CLEOCIN) 300 MG capsule Take 1 capsule (300 mg total) by mouth 3 (three) times daily. 03/31/16 04/10/16  Jenise V Bacon Menshew, PA-C  ibuprofen (ADVIL,MOTRIN) 800 MG tablet Take 1 tablet (800 mg total) by mouth every 8 (eight) hours as needed. 04/09/16   Evon Slack, PA-C  naproxen (NAPROSYN) 500  MG tablet Take 1 tablet (500 mg total) by mouth 2 (two) times daily with a meal. 03/10/16   Sharman Cheek, MD  ondansetron (ZOFRAN ODT) 4 MG disintegrating tablet Take 1 tablet (4 mg total) by mouth every 8 (eight) hours as needed for nausea or vomiting. 03/10/16   Sharman Cheek, MD    Family History No family history on file.  Social History Social History  Substance Use Topics  . Smoking status: Former Smoker    Types: Cigarettes  . Smokeless tobacco: Never Used  . Alcohol use Yes     Comment: socially     Allergies   Tramadol   Review of Systems Review of Systems  Constitutional: Negative for chills and fever.  HENT: Negative for ear pain, sore throat and trouble swallowing.   Eyes: Negative for pain and visual disturbance.  Respiratory: Negative for cough and shortness of breath.   Cardiovascular: Positive for chest pain. Negative for palpitations.  Gastrointestinal: Negative for abdominal pain and vomiting.  Genitourinary: Negative for dysuria and hematuria.  Musculoskeletal: Positive for neck pain (tension posterior cervical). Negative for arthralgias and back pain.  Skin: Negative for color change and rash.  Neurological: Positive for headaches. Negative for dizziness, seizures and syncope.  All other systems reviewed and are negative.    Physical Exam Updated Vital Signs BP 117/68 (BP Location: Left Arm)   Pulse 91   Temp 98.2 F (36.8 C) (Oral)   Resp 18   Ht 6' (1.829  m)   Wt 61.7 kg   LMP 03/29/2016 (Exact Date)   SpO2 100%   BMI 18.44 kg/m   Physical Exam  Constitutional: She is oriented to person, place, and time. She appears well-developed and well-nourished. No distress.  HENT:  Head: Normocephalic and atraumatic.  Right Ear: External ear normal.  Left Ear: External ear normal.  Nose: Nose normal.  Mouth/Throat: Oropharynx is clear and moist.  Eyes: Conjunctivae are normal.  Neck: Normal range of motion. Neck supple.  Negative head  jolt test  Cardiovascular: Normal rate and regular rhythm.   No murmur heard. Pulmonary/Chest: Effort normal and breath sounds normal. No respiratory distress. She has no wheezes. She has no rales. She exhibits tenderness (right-sided midsternal chest tenderness to palpation. No step-off.).  Abdominal: Soft. She exhibits no distension. There is no tenderness. There is no guarding.  Musculoskeletal: She exhibits no edema.  Lymphadenopathy:    She has no cervical adenopathy.  Neurological: She is alert and oriented to person, place, and time. She displays normal reflexes. No cranial nerve deficit or sensory deficit. She exhibits normal muscle tone. She displays a negative Romberg sign. Coordination and gait normal.  Skin: Skin is warm and dry.  Psychiatric: She has a normal mood and affect. Her behavior is normal. Thought content normal.  Nursing note and vitals reviewed.    ED Treatments / Results  Labs (all labs ordered are listed, but only abnormal results are displayed) Labs Reviewed  BASIC METABOLIC PANEL - Abnormal; Notable for the following:       Result Value   Glucose, Bld 107 (*)    All other components within normal limits  CBC - Abnormal; Notable for the following:    RBC 3.69 (*)    Hemoglobin 10.9 (*)    HCT 32.4 (*)    All other components within normal limits  TROPONIN I    EKG  EKG Interpretation None       Radiology Dg Chest 2 View  Result Date: 04/09/2016 CLINICAL DATA:  Dizziness and central chest pain EXAM: CHEST  2 VIEW COMPARISON:  01/21/2016 FINDINGS: The heart size and mediastinal contours are within normal limits. Both lungs are clear. The visualized skeletal structures are unremarkable. IMPRESSION: No active cardiopulmonary disease. Electronically Signed   By: Jasmine PangKim  Fujinaga M.D.   On: 04/09/2016 19:02    Procedures Procedures (including critical care time)  Medications Ordered in ED Medications  ketorolac (TORADOL) 30 MG/ML injection 30 mg (30  mg Intramuscular Given 04/09/16 2048)  orphenadrine (NORFLEX) injection 60 mg (60 mg Intramuscular Given 04/09/16 2048)     Initial Impression / Assessment and Plan / ED Course  I have reviewed the triage vital signs and the nursing notes.  Pertinent labs & imaging results that were available during my care of the patient were reviewed by me and considered in my medical decision making (see chart for details).  Clinical Course     26 year old female with chest wall pain chest x-ray, troponins, EKG normal. Chest pain reproduced to touch. Given injection of Toradol IM, chest pain improved. Patient also with cervical tension, headache, headache improved with Norflex, Toradol. Patient sent home with ibuprofen. She is educated on signs and symptoms to return to the emergency department for.  Final Clinical Impressions(s) / ED Diagnoses   Final diagnoses:  Chest wall pain  Acute non intractable tension-type headache    New Prescriptions New Prescriptions   IBUPROFEN (ADVIL,MOTRIN) 800 MG TABLET    Take 1  tablet (800 mg total) by mouth every 8 (eight) hours as needed.     Evon Slack, PA-C 04/09/16 2141    Minna Antis, MD 04/09/16 2235

## 2016-04-09 NOTE — Discharge Instructions (Signed)
Please take medications as prescribed. Return to the ER for any worsening headache, chest pain, worsening symptoms urgent changes in her health. Follow-up with her doctor in 3-4 days for recheck.

## 2016-04-09 NOTE — ED Triage Notes (Signed)
Pt reports chest pain that radiates to right arm that began today. Pt reports headache and lightheadedness. Pt reports diarrhea but thinks it is due to the clindamycin she is on for spider bite. Denies nausea and vomiting.

## 2016-04-09 NOTE — ED Notes (Signed)
Electronic signature pad not working at this time. Patient educated on discharge instructions and medication prescriptions. Patient verbalizes understanding. All questions answered at this time.

## 2016-06-04 ENCOUNTER — Emergency Department (HOSPITAL_COMMUNITY)
Admission: EM | Admit: 2016-06-04 | Discharge: 2016-06-04 | Disposition: A | Discharge status: Home / Self Care | Payer: 59 | Attending: Emergency Medicine | Admitting: Emergency Medicine

## 2016-06-04 ENCOUNTER — Encounter (HOSPITAL_COMMUNITY): Payer: Self-pay | Admitting: Emergency Medicine

## 2016-06-04 ENCOUNTER — Emergency Department (HOSPITAL_COMMUNITY): Payer: 59

## 2016-06-04 DIAGNOSIS — Z87891 Personal history of nicotine dependence: Secondary | ICD-10-CM | POA: Insufficient documentation

## 2016-06-04 DIAGNOSIS — R0781 Pleurodynia: Secondary | ICD-10-CM | POA: Diagnosis present

## 2016-06-04 DIAGNOSIS — Z79899 Other long term (current) drug therapy: Secondary | ICD-10-CM | POA: Diagnosis not present

## 2016-06-04 DIAGNOSIS — R0789 Other chest pain: Secondary | ICD-10-CM | POA: Diagnosis not present

## 2016-06-04 HISTORY — DX: Anxiety disorder, unspecified: F41.9

## 2016-06-04 LAB — I-STAT BETA HCG BLOOD, ED (MC, WL, AP ONLY): I-stat hCG, quantitative: <5 m[IU]/mL (ref <5)

## 2016-06-04 LAB — CBC
HCT: 35.9 % — ABNORMAL LOW (ref 36.0–46.0)
Hemoglobin: 12.2 g/dL (ref 12.0–15.0)
MCH: 29.5 pg (ref 26.0–34.0)
MCHC: 34 g/dL (ref 30.0–36.0)
MCV: 86.9 fL (ref 78.0–100.0)
PLATELETS: 241 10*3/uL (ref 150–400)
RBC: 4.13 MIL/uL (ref 3.87–5.11)
RDW: 14.8 % (ref 11.5–15.5)
WBC: 6.9 10*3/uL (ref 4.0–10.5)

## 2016-06-04 LAB — BASIC METABOLIC PANEL
Anion gap: 8 (ref 5–15)
BUN: 10 mg/dL (ref 6–20)
CALCIUM: 9.9 mg/dL (ref 8.9–10.3)
CHLORIDE: 104 mmol/L (ref 101–111)
CO2: 22 mmol/L (ref 22–32)
CREATININE: 0.71 mg/dL (ref 0.44–1.00)
GFR calc Af Amer: >60 mL/min (ref >60)
GFR calc non Af Amer: >60 mL/min (ref >60)
Glucose, Bld: 93 mg/dL (ref 65–99)
Potassium: 4.1 mmol/L (ref 3.5–5.1)
Sodium: 134 mmol/L — ABNORMAL LOW (ref 135–145)

## 2016-06-04 LAB — I-STAT TROPONIN, ED: TROPONIN I, POC: 0 ng/mL (ref 0.00–0.08)

## 2016-06-04 LAB — D-DIMER, QUANTITATIVE: D-Dimer, Quant: 0.32 ug/mL-FEU (ref 0.00–0.50)

## 2016-06-04 NOTE — ED Triage Notes (Signed)
Patient is complaining of shortness of breath and chest pain x2 days. Patient is also complaining of a knot behind her left ear.

## 2016-06-04 NOTE — ED Notes (Signed)
Bed: WA09 Expected date:  Expected time:  Means of arrival:  Comments: Shon BatonBrooks

## 2016-06-04 NOTE — ED Provider Notes (Signed)
[facility]-EMERGENCY DEPT Provider Note   CSN: [id number] Arrival date & time: 06/04/16  [id number]     History   Chief Complaint Chief Complaint  Patient presents with  . Chest Pain  . Shortness of Breath    HPI Yolanda Wall is a 26 y.o. female.  The history is provided by the patient. No language interpreter was used.  Chest Pain   Associated symptoms include shortness of breath.  Shortness of Breath  Associated symptoms include chest pain.   Yolanda Wall is a 26 y.o. female who presents to the Emergency Department complaining of chest pain.  She reports 2-3 days of right-sided chest pain that is pleuritic in nature. She has associated mild cough productive of mucus and shortness of breath. Pain is located in the right anterior and axillary chest. No fevers, abdominal pain, vomiting. 2 days ago she did have some right leg swelling that is now resolved. No history of blood clots. She does not take any hormones. She is nonsmoker. Past Medical History:  Diagnosis Date  . Anxiety   . Ovarian cyst     There are no active problems to display for this patient.   History reviewed. No pertinent surgical history.  OB History    No data available       Home Medications    Prior to Admission medications   Medication Sig Start Date End Date Taking? Authorizing Provider  acetaminophen (TYLENOL) 500 MG tablet Take 500 mg by mouth every 6 (six) hours as needed for mild pain, moderate pain, fever or headache.   Yes Historical Provider, MD  naproxen (NAPROSYN) 500 MG tablet Take 1 tablet (500 mg total) by mouth 2 (two) times daily with a meal. Patient not taking: Reported on 06/04/2016 03/10/16   Sharman CheekPhillip Stafford, MD  ondansetron (ZOFRAN ODT) 4 MG disintegrating tablet Take 1 tablet (4 mg total) by mouth every 8 (eight) hours as needed for nausea or vomiting. Patient not taking: Reported on 06/04/2016 03/10/16   Sharman CheekPhillip Stafford, MD    Family History No family history on file.  Social  History Social History  Substance Use Topics  . Smoking status: Former Smoker    Types: Cigarettes  . Smokeless tobacco: Never Used  . Alcohol use Yes     Comment: socially     Allergies   Tramadol   Review of Systems Review of Systems  Respiratory: Positive for shortness of breath.   Cardiovascular: Positive for chest pain.  All other systems reviewed and are negative.    Physical Exam Updated Vital Signs BP 126/77 (BP Location: Left Arm)   Pulse 80   Temp 98.2 F (36.8 C) (Oral)   Resp 16   Ht 6' (1.829 m)   Wt 143 lb (64.9 kg)   LMP 05/08/2016   SpO2 100%   BMI 19.39 kg/m   Physical Exam  Constitutional: She is oriented to person, place, and time. She appears well-developed and well-nourished.  HENT:  Head: Normocephalic and atraumatic.  Cardiovascular: Normal rate and regular rhythm.   No murmur heard. Pulmonary/Chest: Effort normal and breath sounds normal. No respiratory distress.  Mild tenderness in the right upper and right axillary chest wall without any lesions.  Abdominal: Soft. There is no tenderness. There is no rebound and no guarding.  Musculoskeletal: She exhibits no edema or tenderness.  Neurological: She is alert and oriented to person, place, and time.  Skin: Skin is warm and dry.  Psychiatric: She has a normal mood  and affect. Her behavior is normal.  Nursing note and vitals reviewed.    ED Treatments / Results  Labs (all labs ordered are listed, but only abnormal results are displayed) Labs Reviewed  BASIC METABOLIC PANEL - Abnormal; Notable for the following:       Result Value   Sodium 134 (*)    All other components within normal limits  CBC - Abnormal; Notable for the following:    HCT 35.9 (*)    All other components within normal limits  D-DIMER, QUANTITATIVE (NOT AT [facility])  I-STAT TROPOININ, ED  I-STAT BETA HCG BLOOD, ED ([facility], [facility], [facility] ONLY)    EKG  EKG Interpretation  Date/Time:  Friday June 04 2016 07:24:17  EST Ventricular Rate:  84 PR Interval:    QRS Duration: 76 QT Interval:  357 QTC Calculation: 422 R Axis:   89 Text Interpretation:  Sinus rhythm RAE, consider biatrial enlargement RSR' in V1 or V2, probably normal variant Nonspecific T abnormalities, lateral leads Baseline wander in lead(s) I V3 Confirmed by Lincoln Brigham [phone]) on 06/04/2016 7:28:59 AM       Radiology Dg Chest 2 View  Result Date: 06/04/2016 CLINICAL DATA:  Chest pain, shortness of breath. EXAM: CHEST  2 VIEW COMPARISON:  None. FINDINGS: The heart size and mediastinal contours are within normal limits. Both lungs are clear. No pneumothorax or pleural effusion is noted. The visualized skeletal structures are unremarkable. IMPRESSION: No active cardiopulmonary disease. Electronically Signed   By: Lupita Raider, M.D.   On: 06/04/2016 07:59    Procedures Procedures (including critical care time)  Medications Ordered in ED Medications - No data to display   Initial Impression / Assessment and Plan / ED Course  I have reviewed the triage vital signs and the nursing notes.  Pertinent labs & imaging results that were available during my care of the patient were reviewed by me and considered in my medical decision making (see chart for details).     Patient here for evaluation of right-sided chest pain with associated shortness of breath and cough. She is well appearing on examination with no respiratory distress. Pain is reproducible on examination and she is low risk for PE, d-dimer negative. Current clinical picture is not consistent with pneumonia, PE, cholecystitis, dissection. Counseled patient on home care for chest wall pain, outpatient follow-up, return precautions.  Final Clinical Impressions(s) / ED Diagnoses   Final diagnoses:  Chest wall pain    New Prescriptions Current Discharge Medication List       Tilden Fossa, MD 06/04/16 [phone]

## 2016-06-04 NOTE — ED Notes (Signed)
ED Provider at bedside. 

## 2016-11-18 ENCOUNTER — Emergency Department (HOSPITAL_COMMUNITY): Payer: Self-pay

## 2016-11-18 ENCOUNTER — Encounter (HOSPITAL_COMMUNITY): Payer: Self-pay | Admitting: Emergency Medicine

## 2016-11-18 ENCOUNTER — Emergency Department (HOSPITAL_COMMUNITY)
Admission: EM | Admit: 2016-11-18 | Discharge: 2016-11-18 | Disposition: A | Discharge status: Home / Self Care | Payer: Self-pay | Attending: Emergency Medicine | Admitting: Emergency Medicine

## 2016-11-18 DIAGNOSIS — R1032 Left lower quadrant pain: Secondary | ICD-10-CM | POA: Insufficient documentation

## 2016-11-18 DIAGNOSIS — R0789 Other chest pain: Secondary | ICD-10-CM | POA: Insufficient documentation

## 2016-11-18 DIAGNOSIS — R05 Cough: Secondary | ICD-10-CM | POA: Insufficient documentation

## 2016-11-18 DIAGNOSIS — J029 Acute pharyngitis, unspecified: Secondary | ICD-10-CM | POA: Insufficient documentation

## 2016-11-18 DIAGNOSIS — R109 Unspecified abdominal pain: Secondary | ICD-10-CM

## 2016-11-18 DIAGNOSIS — Z87891 Personal history of nicotine dependence: Secondary | ICD-10-CM | POA: Insufficient documentation

## 2016-11-18 LAB — CBC WITH DIFFERENTIAL/PLATELET
BASOS ABS: 0 10*3/uL (ref 0.0–0.1)
BASOS PCT: 0 %
Eosinophils Absolute: 0.1 10*3/uL (ref 0.0–0.7)
Eosinophils Relative: 1 %
HEMATOCRIT: 31.8 % — AB (ref 36.0–46.0)
HEMOGLOBIN: 10.7 g/dL — AB (ref 12.0–15.0)
Lymphocytes Relative: 27 %
Lymphs Abs: 2.7 10*3/uL (ref 0.7–4.0)
MCH: 29.2 pg (ref 26.0–34.0)
MCHC: 33.6 g/dL (ref 30.0–36.0)
MCV: 86.9 fL (ref 78.0–100.0)
Monocytes Absolute: 0.7 10*3/uL (ref 0.1–1.0)
Monocytes Relative: 7 %
NEUTROS ABS: 6.5 10*3/uL (ref 1.7–7.7)
NEUTROS PCT: 65 %
Platelets: 238 10*3/uL (ref 150–400)
RBC: 3.66 MIL/uL — AB (ref 3.87–5.11)
RDW: 14.4 % (ref 11.5–15.5)
WBC: 10.1 10*3/uL (ref 4.0–10.5)

## 2016-11-18 LAB — BASIC METABOLIC PANEL
ANION GAP: 6 (ref 5–15)
BUN: 13 mg/dL (ref 6–20)
CHLORIDE: 108 mmol/L (ref 101–111)
CO2: 29 mmol/L (ref 22–32)
Calcium: 9.4 mg/dL (ref 8.9–10.3)
Creatinine, Ser: 0.74 mg/dL (ref 0.44–1.00)
GFR calc non Af Amer: >60 mL/min (ref >60)
Glucose, Bld: 106 mg/dL — ABNORMAL HIGH (ref 65–99)
POTASSIUM: 4.1 mmol/L (ref 3.5–5.1)
Sodium: 143 mmol/L (ref 135–145)

## 2016-11-18 LAB — URINALYSIS, ROUTINE W REFLEX MICROSCOPIC
BACTERIA UA: NONE SEEN
BILIRUBIN URINE: NEGATIVE
Glucose, UA: NEGATIVE mg/dL
HGB URINE DIPSTICK: NEGATIVE
Ketones, ur: 5 mg/dL — AB
Nitrite: NEGATIVE
Protein, ur: 30 mg/dL — AB
Specific Gravity, Urine: 1.026 (ref 1.005–1.030)
pH: 6 (ref 5.0–8.0)

## 2016-11-18 LAB — PREGNANCY, URINE: PREG TEST UR: NEGATIVE

## 2016-11-18 MED ORDER — CEPHALEXIN 500 MG PO CAPS: 500.0000 mg | ORAL_CAPSULE | Freq: Two times a day (BID) | ORAL | 0 refills | Status: DC

## 2016-11-18 MED ORDER — KETOROLAC TROMETHAMINE 30 MG/ML IJ SOLN
30.0000 mg | Freq: Once | INTRAMUSCULAR | Status: AC
  Administered 2016-11-18: 30 mg via INTRAMUSCULAR
  Filled 2016-11-18: qty 1

## 2016-11-18 MED ORDER — NAPROXEN 500 MG PO TABS: 500.0000 mg | ORAL_TABLET | Freq: Two times a day (BID) | ORAL | 0 refills | Status: AC

## 2016-11-18 NOTE — ED Notes (Signed)
Pt unable to give urine sample, but aware that we need one.  

## 2016-11-18 NOTE — ED Triage Notes (Signed)
Pt reports recurrent chest wall pain which she was seen in the ED for same in the past

## 2016-11-18 NOTE — ED Notes (Signed)
Pt still unable to give urine sample, but aware that we still need one.

## 2016-11-18 NOTE — ED Provider Notes (Signed)
[facility]-EMERGENCY DEPT Provider Note   CSN: [id number] Arrival date & time: 11/18/16  0054     History   Chief Complaint Chief Complaint  Patient presents with  . Chest Pain    HPI Yolanda Wall is a 26 y.o. female.  Patient presents to the emergency department with chief complaint of upper chest wall pain. She states that she has had these symptoms several times in the past. She reports that the symptoms are worsened with palpation of her upper chest wall. She reports increased pain when taken a deep breath. She also reports mild sore throat and dry cough. 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 Additionally, patient states that she has been unable to have bowel movement for the past several days, and has some left flank pain. She denies any fevers or chills. She has not taken anything for symptoms. There are no other associated symptoms or modifying factors.   The history is provided by the patient. No language interpreter was used.    Past Medical History:  Diagnosis Date  . Anxiety   . Ovarian cyst     There are no active problems to display for this patient.   History reviewed. No pertinent surgical history.  OB History    No data available       Home Medications    Prior to Admission medications   Medication Sig Start Date End Date Taking? Authorizing Provider  acetaminophen (TYLENOL) 500 MG tablet Take 500 mg by mouth every 6 (six) hours as needed for mild pain, moderate pain, fever or headache.    [provider]  naproxen (NAPROSYN) 500 MG tablet Take 1 tablet (500 mg total) by mouth 2 (two) times daily with a meal. Patient not taking: Reported on 06/04/2016 03/10/16   Sharman Cheek, MD  ondansetron (ZOFRAN ODT) 4 MG disintegrating tablet Take 1 tablet (4 mg total) by mouth every 8 (eight) hours as needed for nausea or vomiting. Patient not taking: Reported on 06/04/2016 03/10/16    Sharman Cheek, MD    Family History History reviewed. No pertinent family history.  Social History Social History  Substance Use Topics  . Smoking status: Former Smoker    Types: Cigarettes  . Smokeless tobacco: Never Used  . Alcohol use Yes     Comment: socially     Allergies   Tramadol   Review of Systems Review of Systems  All other systems reviewed and are negative.    Physical Exam Updated Vital Signs BP 128/78 (BP Location: Right Arm)   Pulse 79   Temp 98.2 F (36.8 C) (Oral)   Resp 20   Ht 6' (1.829 m)   Wt 63.5 kg (140 lb)   LMP 11/14/2016   SpO2 100%   BMI 18.99 kg/m   Physical Exam  Constitutional: She is oriented to person, place, and time. She appears well-developed and well-nourished.  HENT:  Head: Normocephalic and atraumatic.  Eyes: Pupils are equal, round, and reactive to light. Conjunctivae and EOM are normal.  Neck: Normal range of motion. Neck supple.  Cardiovascular: Normal rate and regular rhythm.  Exam reveals no gallop and no friction rub.   No murmur heard. Pulmonary/Chest: Effort normal and breath sounds normal. No respiratory distress. She has no wheezes. She has no rales. She exhibits no tenderness.  upper anterior chest wall tender to palpation  Abdominal: Soft. Bowel sounds are normal. She exhibits no distension and no mass.  There is no tenderness. There is no rebound and no guarding.  No focal abdominal tenderness, no RLQ tenderness or pain at McBurney's point, no RUQ tenderness or Murphy's sign, no left-sided abdominal tenderness, no fluid wave, or signs of peritonitis   Musculoskeletal: Normal range of motion. She exhibits no edema or tenderness.  Pain is reproduced in the pectoralis major muscle group with chest flexion  Neurological: She is alert and oriented to person, place, and time.  Skin: Skin is warm and dry.  Psychiatric: She has a normal mood and affect. Her behavior is normal. Judgment and thought content normal.   Nursing note and vitals reviewed.    ED Treatments / Results  Labs (all labs ordered are listed, but only abnormal results are displayed) Labs Reviewed  CBC WITH DIFFERENTIAL/PLATELET - Abnormal; Notable for the following:       Result Value   RBC 3.66 (*)    Hemoglobin 10.7 (*)    HCT 31.8 (*)    All other components within normal limits  BASIC METABOLIC PANEL - Abnormal; Notable for the following:    Glucose, Bld 106 (*)    All other components within normal limits  URINALYSIS, ROUTINE W REFLEX MICROSCOPIC - Abnormal; Notable for the following:    APPearance HAZY (*)    Ketones, ur 5 (*)    Protein, ur 30 (*)    Leukocytes, UA SMALL (*)    Squamous Epithelial / LPF 6-30 (*)    All other components within normal limits  PREGNANCY, URINE    EKG  EKG Interpretation  Date/Time:  Thursday November 18 2016 01:13:41 EDT Ventricular Rate:  76 PR Interval:    QRS Duration: 68 QT Interval:  425 QTC Calculation: 478 R Axis:   83 Text Interpretation:  Sinus rhythm Borderline prolonged PR interval Consider left atrial enlargement ST elev, probable normal early repol pattern Baseline wander in lead(s) III Interpretation limited secondary to artifact Confirmed by Zadie Rhine ([id number]) on 11/18/2016 1:18:20 AM       Radiology No results found.  Procedures Procedures (including critical care time)  Medications Ordered in ED Medications - No data to display   Initial Impression / Assessment and Plan / ED Course  I have reviewed the triage vital signs and the nursing notes.  Pertinent labs & imaging results that were available during my care of the patient were reviewed by me and considered in my medical decision making (see chart for details).     Patient with chest wall pain.  Reproducible with palpation.  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  Low risk for PE and ACS.  Multiple visits for the same.  Recommend PCP follow-up.  Will  cover for UTI given frequency and flank pain.  Final Clinical Impressions(s) / ED Diagnoses   Final diagnoses:  Chest wall pain  Flank pain    New Prescriptions New Prescriptions   CEPHALEXIN (KEFLEX) 500 MG CAPSULE    Take 1 capsule (500 mg total) by mouth 2 (two) times daily.     Roxy Horseman, PA-C 11/18/16 6045    Zadie Rhine, MD 11/18/16 [phone]

## 2017-01-04 ENCOUNTER — Emergency Department (HOSPITAL_COMMUNITY)
Admission: EM | Admit: 2017-01-04 | Discharge: 2017-01-04 | Discharge status: Against Medical Advice | Payer: 59 | Attending: Emergency Medicine | Admitting: Emergency Medicine

## 2017-01-04 ENCOUNTER — Encounter (HOSPITAL_COMMUNITY): Payer: Self-pay

## 2017-01-04 DIAGNOSIS — R51 Headache: Secondary | ICD-10-CM | POA: Diagnosis present

## 2017-01-04 DIAGNOSIS — Z5321 Procedure and treatment not carried out due to patient leaving prior to being seen by health care provider: Secondary | ICD-10-CM | POA: Insufficient documentation

## 2017-01-04 HISTORY — DX: Major depressive disorder, single episode, unspecified: F32.9

## 2017-01-04 HISTORY — DX: Bipolar disorder, unspecified: F31.9

## 2017-01-04 HISTORY — DX: Migraine, unspecified, not intractable, without status migrainosus: G43.909

## 2017-01-04 HISTORY — DX: Depression, unspecified: F32.A

## 2017-01-04 NOTE — ED Notes (Signed)
Called patient to go to a room x 3 and no answer. 

## 2017-01-04 NOTE — ED Triage Notes (Signed)
Pt reports 8/10 H/A pain since Friday. Pt denies loc and traumatic injury. Pt has hx of migraines. Pt A+OX4, speaking in complete sentences, ambulatory to triage w/ normal gait.

## 2017-01-04 NOTE — ED Notes (Signed)
Called Pt to be roomed x1 no response. 

## 2017-05-11 ENCOUNTER — Encounter: Payer: Self-pay | Admitting: Emergency Medicine

## 2017-05-11 ENCOUNTER — Other Ambulatory Visit: Payer: Self-pay

## 2017-05-11 ENCOUNTER — Emergency Department
Admission: EM | Admit: 2017-05-11 | Discharge: 2017-05-11 | Disposition: A | Discharge status: Home / Self Care | Payer: 59 | Attending: Emergency Medicine | Admitting: Emergency Medicine

## 2017-05-11 DIAGNOSIS — Z79899 Other long term (current) drug therapy: Secondary | ICD-10-CM | POA: Insufficient documentation

## 2017-05-11 DIAGNOSIS — R51 Headache: Secondary | ICD-10-CM | POA: Insufficient documentation

## 2017-05-11 DIAGNOSIS — R519 Headache, unspecified: Secondary | ICD-10-CM

## 2017-05-11 DIAGNOSIS — Z87891 Personal history of nicotine dependence: Secondary | ICD-10-CM | POA: Insufficient documentation

## 2017-05-11 LAB — POCT PREGNANCY, URINE: Preg Test, Ur: NEGATIVE

## 2017-05-11 MED ORDER — SODIUM CHLORIDE 0.9 % IV BOLUS (SEPSIS)
500.0000 mL | Freq: Once | INTRAVENOUS | Status: AC
  Administered 2017-05-11: 500 mL via INTRAVENOUS

## 2017-05-11 MED ORDER — KETOROLAC TROMETHAMINE 30 MG/ML IJ SOLN
15.0000 mg | Freq: Once | INTRAMUSCULAR | Status: AC
  Administered 2017-05-11: 15 mg via INTRAVENOUS
  Filled 2017-05-11: qty 1

## 2017-05-11 MED ORDER — PROCHLORPERAZINE EDISYLATE 5 MG/ML IJ SOLN
10.0000 mg | Freq: Once | INTRAMUSCULAR | Status: AC
  Administered 2017-05-11: 10 mg via INTRAVENOUS
  Filled 2017-05-11: qty 2

## 2017-05-11 NOTE — ED Triage Notes (Signed)
Pt wit headache for mths. Sensitivity to light.

## 2017-05-11 NOTE — ED Provider Notes (Addendum)
[facility] Emergency Department Provider Note  ____________________________________________   I have reviewed the triage vital signs and the nursing notes. Where available I have reviewed prior notes and, if possible and indicated, outside hospital notes.    HISTORY  Chief Complaint Headache    HPI Yolanda SpringsJocelyn C Weinman is a 27 y.o. female with a history of headaches "since I was a baby", states that she has been having her usual headaches all year but this 1 is lasting slightly longer, she has had imaging.  Gradual onset not worst headache of life, no vomiting, positive nausea.  Denies any fever or stiff neck.  States that it was "general headache", positive photophobia as usual.  No other acute alleviating or aggravating symptoms. Nothing makes it better, loud noises and light makes it worse.  Gradual onset not worst headache of life, mild severity.  No recent tick bites.   Past Medical History:  Diagnosis Date  . Anxiety   . Bipolar 1 disorder (HCC)   . Depression   . Migraines   . Ovarian cyst     There are no active problems to display for this patient.   History reviewed. No pertinent surgical history.  Prior to Admission medications   Medication Sig Start Date End Date Taking? Authorizing Provider  acetaminophen (TYLENOL) 500 MG tablet Take 500 mg by mouth every 6 (six) hours as needed for mild pain, moderate pain, fever or headache.   Yes [provider]  cephALEXin (KEFLEX) 500 MG capsule Take 1 capsule (500 mg total) by mouth 2 (two) times daily. Patient not taking: Reported on 05/11/2017 11/18/16   Roxy HorsemanBrowning, Robert, PA-C  naproxen (NAPROSYN) 500 MG tablet Take 1 tablet (500 mg total) by mouth 2 (two) times daily with a meal. Patient not taking: Reported on 05/11/2017 11/18/16   Roxy HorsemanBrowning, Robert, PA-C  ondansetron (ZOFRAN ODT) 4 MG disintegrating tablet Take 1 tablet (4 mg total) by mouth every 8 (eight) hours as needed for nausea or  vomiting. Patient not taking: Reported on 06/04/2016 03/10/16   Sharman CheekStafford, Phillip, MD    Allergies Tramadol  No family history on file.  Social History Social History   Tobacco Use  . Smoking status: Former Smoker    Types: Cigarettes  . Smokeless tobacco: Never Used  Substance Use Topics  . Alcohol use: Yes    Comment: socially  . Drug use: No    Review of Systems Constitutional: No fever/chills Eyes: No visual changes. ENT: No sore throat. No stiff neck no neck pain Cardiovascular: Denies chest pain. Respiratory: Denies shortness of breath. Gastrointestinal:   no vomiting.  No diarrhea.  No constipation. Genitourinary: Negative for dysuria. Musculoskeletal: Negative lower extremity swelling Skin: Negative for rash. Neurological: Negative for severe headaches, focal weakness or numbness.   ____________________________________________   PHYSICAL EXAM:  VITAL SIGNS: ED Triage Vitals  Enc Vitals Group     BP 05/11/17 0906 (!) 111/59     Pulse Rate 05/11/17 0906 68     Resp 05/11/17 0906 20     Temp 05/11/17 0906 98.4 F (36.9 C)     Temp Source 05/11/17 0906 Oral     SpO2 05/11/17 0906 100 %     Weight 05/11/17 0904 140 lb (63.5 kg)     Height --      Head Circumference --      Peak Flow --      Pain Score 05/11/17 0904 10     Pain Loc --  Pain Edu? --      Excl. in GC? --     Constitutional: Alert and oriented. Well appearing and in no acute distress.  Laughing and joking in the room watching TV with her legs crossed Eyes: Conjunctivae are normal Head: Atraumatic HEENT: No congestion/rhinnorhea. Mucous membranes are moist.  Oropharynx non-erythematous Neck:   Nontender with no meningismus, no masses, no stridor Cardiovascular: Normal rate, regular rhythm. Grossly normal heart sounds.  Good peripheral circulation. Respiratory: Normal respiratory effort.  No retractions. Lungs CTAB. Abdominal: Soft and nontender. No distention. No guarding no  rebound Back:  There is no focal tenderness or step off.  there is no midline tenderness there are no lesions noted. there is no CVA tenderness Musculoskeletal: No lower extremity tenderness, no upper extremity tenderness. No joint effusions, no DVT signs strong distal pulses no edema Neurologic:  Normal speech and language. No gross focal neurologic deficits are appreciated.  Skin:  Skin is warm, dry and intact. No rash noted. Psychiatric: Mood and affect are normal. Speech and behavior are normal.  ____________________________________________   LABS (all labs ordered are listed, but only abnormal results are displayed)  Labs Reviewed  POC URINE PREG, ED    Pertinent labs  results that were available during my care of the patient were reviewed by me and considered in my medical decision making (see chart for details). ____________________________________________  EKG  I personally interpreted any EKGs ordered by me or triage  ____________________________________________  RADIOLOGY  Pertinent labs & imaging results that were available during my care of the patient were reviewed by me and considered in my medical decision making (see chart for details). If possible, patient and/or family made aware of any abnormal findings.  No results found. ____________________________________________    PROCEDURES  Procedure(s) performed: None  Procedures  Critical Care performed: None  ____________________________________________   INITIAL IMPRESSION / ASSESSMENT AND PLAN / ED COURSE  Pertinent labs & imaging results that were available during my care of the patient were reviewed by me and considered in my medical decision making (see chart for details). Patient here with chronic recurrent migraine, very well-appearing neurologically intact,   Pt remains at neurologic baseline. At this time, there is nothing to suggest or support the diagnosis of subarachnoid hemorrhage,  aneurysmal event, meningitis, tumor or mass, cavernous thrombosis, encephalitis, ischemic stroke, pseudotumor cerebri, glaucoma, temporal arteritis, or any other acute intracrania/neurological process.  We will give routine migraine coverage, and reassess.  She denies pregnancy.   ----------------------------------------- 12:44 PM on 05/11/2017 -----------------------------------------  Headache gone, neurologically intact at baseline at this time, eager to go home   Extensive return precautions including but not limited to any new or worrisome symptoms such as worsening of, or change in, headache, any neurological symptoms, fever etc.. Natural disease course discussed with patient. The need for follow-up and all of my customary return precautions have been discussed as well.    ____________________________________________   FINAL CLINICAL IMPRESSION(S) / ED DIAGNOSES  Final diagnoses:  None      This chart was dictated using voice recognition software.  Despite best efforts to proofread,  errors can occur which can change meaning.      Jeanmarie Plant, MD 05/11/17 1204    Jeanmarie Plant, MD 05/11/17 [id number]

## 2018-04-28 ENCOUNTER — Other Ambulatory Visit: Payer: Self-pay | Admitting: Family Medicine

## 2018-04-28 DIAGNOSIS — N6310 Unspecified lump in the right breast, unspecified quadrant: Secondary | ICD-10-CM

## 2018-04-28 DIAGNOSIS — N644 Mastodynia: Secondary | ICD-10-CM

## 2018-05-04 ENCOUNTER — Ambulatory Visit
Admission: RE | Admit: 2018-05-04 | Discharge: 2018-05-04 | Disposition: A | Discharge status: Home / Self Care | Payer: BLUE CROSS/BLUE SHIELD | Source: Ambulatory Visit | Attending: Family Medicine | Admitting: Family Medicine

## 2018-05-04 DIAGNOSIS — N6310 Unspecified lump in the right breast, unspecified quadrant: Secondary | ICD-10-CM

## 2018-05-04 DIAGNOSIS — N644 Mastodynia: Secondary | ICD-10-CM

## 2018-05-25 ENCOUNTER — Encounter (HOSPITAL_COMMUNITY): Payer: Self-pay | Admitting: Emergency Medicine

## 2018-05-25 ENCOUNTER — Emergency Department (HOSPITAL_COMMUNITY)
Admission: EM | Admit: 2018-05-25 | Discharge: 2018-05-25 | Disposition: A | Discharge status: Home / Self Care | Payer: BLUE CROSS/BLUE SHIELD | Attending: Emergency Medicine | Admitting: Emergency Medicine

## 2018-05-25 ENCOUNTER — Emergency Department (HOSPITAL_COMMUNITY): Payer: BLUE CROSS/BLUE SHIELD

## 2018-05-25 DIAGNOSIS — F1290 Cannabis use, unspecified, uncomplicated: Secondary | ICD-10-CM | POA: Diagnosis not present

## 2018-05-25 DIAGNOSIS — Z87891 Personal history of nicotine dependence: Secondary | ICD-10-CM | POA: Diagnosis not present

## 2018-05-25 DIAGNOSIS — R112 Nausea with vomiting, unspecified: Secondary | ICD-10-CM | POA: Diagnosis not present

## 2018-05-25 DIAGNOSIS — R197 Diarrhea, unspecified: Secondary | ICD-10-CM | POA: Diagnosis not present

## 2018-05-25 DIAGNOSIS — R102 Pelvic and perineal pain: Secondary | ICD-10-CM | POA: Diagnosis present

## 2018-05-25 LAB — URINALYSIS, ROUTINE W REFLEX MICROSCOPIC
BILIRUBIN URINE: NEGATIVE
GLUCOSE, UA: NEGATIVE mg/dL
Ketones, ur: 5 mg/dL — AB
Leukocytes,Ua: NEGATIVE
Nitrite: NEGATIVE
Protein, ur: NEGATIVE mg/dL
Specific Gravity, Urine: 1.02 (ref 1.005–1.030)
pH: 5 (ref 5.0–8.0)

## 2018-05-25 LAB — POC URINE PREG, ED: Preg Test, Ur: NEGATIVE

## 2018-05-25 LAB — PREGNANCY, URINE: Preg Test, Ur: NEGATIVE

## 2018-05-25 NOTE — Discharge Instructions (Addendum)
Please follow-up with the gynecologist as scheduled

## 2018-05-25 NOTE — ED Notes (Signed)
Pt remians in [place]

## 2018-05-25 NOTE — ED Triage Notes (Addendum)
Pt on menstrual cycle. Took 400mg  ibuprofen this morning. Has a cyst. Has hx of this since 2. Came here to make sure "cyst ok". 1 episode of diarr and vomiting. She reports she could not work today. She states just tested last month for STDs and has not had sex since then. States this happens every month. Trying to get in with Dr. Mindi Slicker with [facility] with referral from her PCP who has been managing this.

## 2018-05-25 NOTE — ED Notes (Signed)
Pt returned from [place]

## 2018-05-30 NOTE — ED Provider Notes (Signed)
[facility] EMERGENCY DEPARTMENT Provider Note   CSN: [id number] Arrival date & time: 05/25/18  3254    History   Chief Complaint Chief Complaint  Patient presents with  . Menstrual Problem    HPI Yolanda Wall is a 28 y.o. female.     HPI Patient is a 28 year old female with a history of ovarian cyst who presents the emergency department complaints of one episode of nausea vomiting and diarrhea which occurred earlier today.  Her pain was severe noted pain in the left lower quadrant.  No history of diverticulitis.  No back pain.  No urinary complaints.  She reports she has had this pain intermittently over a month.  She has not seen a gynecologist at this point.  She denies fevers and chills.  No nausea at this time.  Mild discomfort.   Past Medical History:  Diagnosis Date  . Anxiety   . Bipolar 1 disorder (HCC)   . Depression   . Migraines   . Ovarian cyst     There are no active problems to display for this patient.   History reviewed. No pertinent surgical history.   OB History   No obstetric history on file.      Home Medications    Prior to Admission medications   Medication Sig Start Date End Date Taking? Authorizing Provider  acetaminophen (TYLENOL) 500 MG tablet Take 500 mg by mouth every 6 (six) hours as needed for mild pain, moderate pain, fever or headache.    [provider]  cephALEXin (KEFLEX) 500 MG capsule Take 1 capsule (500 mg total) by mouth 2 (two) times daily. Patient not taking: Reported on 05/11/2017 11/18/16   Roxy Horseman, PA-C  naproxen (NAPROSYN) 500 MG tablet Take 1 tablet (500 mg total) by mouth 2 (two) times daily with a meal. Patient not taking: Reported on 05/11/2017 11/18/16   Roxy Horseman, PA-C  ondansetron (ZOFRAN ODT) 4 MG disintegrating tablet Take 1 tablet (4 mg total) by mouth every 8 (eight) hours as needed for nausea or vomiting. Patient not taking: Reported on 06/04/2016 03/10/16    Sharman Cheek, MD    Family History Family History  Problem Relation Age of Onset  . Ovarian cancer Mother   . Ovarian cancer Maternal Aunt   . Breast cancer Maternal Grandmother     Social History Social History   Tobacco Use  . Smoking status: Former Smoker    Types: Cigarettes  . Smokeless tobacco: Never Used  Substance Use Topics  . Alcohol use: Yes    Comment: socially  . Drug use: No    Frequency: 5.0 times per week    Types: Marijuana     Allergies   Tramadol   Review of Systems Review of Systems  All other systems reviewed and are negative.    Physical Exam Updated Vital Signs BP 124/82 (BP Location: Right Arm)   Pulse 79   Temp 98.2 F (36.8 C) (Oral)   Resp 17   LMP 05/23/2018   SpO2 100%   Physical Exam Vitals signs and nursing note reviewed.  Constitutional:      General: She is not in acute distress.    Appearance: She is well-developed.  HENT:     Head: Normocephalic and atraumatic.  Neck:     Musculoskeletal: Normal range of motion.  Cardiovascular:     Rate and Rhythm: Normal rate and regular rhythm.     Heart sounds: Normal heart sounds.  Pulmonary:  Effort: Pulmonary effort is normal.     Breath sounds: Normal breath sounds.  Abdominal:     General: There is no distension.     Palpations: Abdomen is soft.     Tenderness: There is no abdominal tenderness.  Musculoskeletal: Normal range of motion.  Skin:    General: Skin is warm and dry.  Neurological:     Mental Status: She is alert and oriented to person, place, and time.  Psychiatric:        Judgment: Judgment normal.      ED Treatments / Results  Labs (all labs ordered are listed, but only abnormal results are displayed) Labs Reviewed  URINALYSIS, ROUTINE W REFLEX MICROSCOPIC - Abnormal; Notable for the following components:      Result Value   Hgb urine dipstick MODERATE (*)    Ketones, ur 5 (*)    Bacteria, UA RARE (*)    All other components within  normal limits  PREGNANCY, URINE  POC URINE PREG, ED    EKG None  Radiology EXAM: TRANSABDOMINAL AND TRANSVAGINAL ULTRASOUND OF PELVIS  DOPPLER ULTRASOUND OF OVARIES  TECHNIQUE: Both transabdominal and transvaginal ultrasound examinations of the pelvis were performed. Transabdominal technique was performed for global imaging of the pelvis including uterus, ovaries, adnexal regions, and pelvic cul-de-sac.  It was necessary to proceed with endovaginal exam following the transabdominal exam to visualize the endometrium. Color and duplex Doppler ultrasound was utilized to evaluate blood flow to the ovaries.  COMPARISON:  None.  FINDINGS: Uterus  Measurements: 7.2 x 3.3 x 4.3 cm = volume: 53 mL. No fibroids or other mass visualized.  Endometrium  Thickness: 6.1 mm.  No focal abnormality visualized.  Right ovary  Measurements: 3.4 x 1.9 x 4.5 cm = volume: 15 mL. Normal appearance/no adnexal mass.  Left ovary  Measurements: 3.1 x 1.9 x 3.2 cm = volume: 10 mL. Normal appearance/no adnexal mass.  Pulsed Doppler evaluation of the bilateral ovaries demonstrates normal low-resistance arterial and venous waveforms.  Other findings  Trace free fluid, likely physiologic.  IMPRESSION: Normal pelvic ultrasound.  Procedures Procedures (including critical care time)  Medications Ordered in ED Medications - No data to display   Initial Impression / Assessment and Plan / ED Course  I have reviewed the triage vital signs and the nursing notes.  Pertinent labs & imaging results that were available during my care of the patient were reviewed by me and considered in my medical decision making (see chart for details).        Ultrasound demonstrates no significant pathology.  No significant peritoneal signs or focal tenderness in the left lower quadrant.  No vaginal complaints.  Outpatient OB/GYN follow-up.  Could represent endometriosis.  No indication  for additional work-up at this time.  Discharged home in good condition.  No signs to suggest ovarian torsion.  Final Clinical Impressions(s) / ED Diagnoses   Final diagnoses:  Pelvic pain    ED Discharge Orders    None       Azalia Bilis, MD 05/30/18 1100

## 2019-06-29 ENCOUNTER — Encounter (HOSPITAL_COMMUNITY): Payer: Self-pay | Admitting: Emergency Medicine

## 2019-06-29 ENCOUNTER — Emergency Department (HOSPITAL_COMMUNITY)
Admission: EM | Admit: 2019-06-29 | Discharge: 2019-06-29 | Disposition: A | Discharge status: Home / Self Care | Payer: Self-pay | Attending: Emergency Medicine | Admitting: Emergency Medicine

## 2019-06-29 ENCOUNTER — Emergency Department (HOSPITAL_COMMUNITY): Payer: Self-pay

## 2019-06-29 ENCOUNTER — Other Ambulatory Visit: Payer: Self-pay

## 2019-06-29 DIAGNOSIS — Z87891 Personal history of nicotine dependence: Secondary | ICD-10-CM | POA: Insufficient documentation

## 2019-06-29 DIAGNOSIS — R0789 Other chest pain: Secondary | ICD-10-CM | POA: Insufficient documentation

## 2019-06-29 DIAGNOSIS — M79601 Pain in right arm: Secondary | ICD-10-CM | POA: Insufficient documentation

## 2019-06-29 DIAGNOSIS — F319 Bipolar disorder, unspecified: Secondary | ICD-10-CM | POA: Insufficient documentation

## 2019-06-29 LAB — BASIC METABOLIC PANEL
Anion gap: 9 (ref 5–15)
BUN: 15 mg/dL (ref 6–20)
CO2: 25 mmol/L (ref 22–32)
Calcium: 9.7 mg/dL (ref 8.9–10.3)
Chloride: 104 mmol/L (ref 98–111)
Creatinine, Ser: 0.67 mg/dL (ref 0.44–1.00)
GFR calc Af Amer: >60 mL/min (ref >60)
GFR calc non Af Amer: >60 mL/min (ref >60)
Glucose, Bld: 103 mg/dL — ABNORMAL HIGH (ref 70–99)
Potassium: 4 mmol/L (ref 3.5–5.1)
Sodium: 138 mmol/L (ref 135–145)

## 2019-06-29 LAB — TROPONIN I (HIGH SENSITIVITY): Troponin I (High Sensitivity): 4 ng/L (ref <18)

## 2019-06-29 LAB — CBC
HCT: 34.6 % — ABNORMAL LOW (ref 36.0–46.0)
Hemoglobin: 11 g/dL — ABNORMAL LOW (ref 12.0–15.0)
MCH: 30.3 pg (ref 26.0–34.0)
MCHC: 31.8 g/dL (ref 30.0–36.0)
MCV: 95.3 fL (ref 80.0–100.0)
Platelets: 299 10*3/uL (ref 150–400)
RBC: 3.63 MIL/uL — ABNORMAL LOW (ref 3.87–5.11)
RDW: 14 % (ref 11.5–15.5)
WBC: 11.9 10*3/uL — ABNORMAL HIGH (ref 4.0–10.5)
nRBC: 0 % (ref 0.0–0.2)

## 2019-06-29 LAB — I-STAT BETA HCG BLOOD, ED (MC, WL, AP ONLY): I-stat hCG, quantitative: <5 m[IU]/mL (ref <5)

## 2019-06-29 MED ORDER — SODIUM CHLORIDE 0.9% FLUSH: 3.0000 mL | Freq: Once | INTRAVENOUS | Status: DC

## 2019-06-29 NOTE — ED Provider Notes (Signed)
[facility] Provider Note   CSN: [id number] Arrival date & time: 06/29/19  2155     History Chief Complaint  Patient presents with  . Chest Pain    Yolanda Wall is a 29 y.o. female with a past medical history of anxiety, bipolar 1, depression, who presents today for evaluation of right-sided chest pain.  She reports that she is having pain in her right upper chest that radiates into her right arm.  The pain is sharp.  She denies any new activities.  No fevers, cough, or shortness of breath.  She reports that she has been taking apetamin over the past month which is a non-FDA approved weight gain supplement.  She reports that she has not had any in the past 3 days. She reports that her chest pain has been present for approximately 1 week.  She denies any cough, shortness of breath.  No fever.  No nausea vomiting diarrhea.  She states that she "does not like taking medicine" and declines any ibuprofen or other medications.  No PE/DVT history.  She does not take any hormones.  She denies hemoptysis.   HPI     Past Medical History:  Diagnosis Date  . Anxiety   . Bipolar 1 disorder (HCC)   . Depression   . Migraines   . Ovarian cyst     There are no problems to display for this patient.   History reviewed. No pertinent surgical history.   OB History   No obstetric history on file.     Family History  Problem Relation Age of Onset  . Ovarian cancer Mother   . Ovarian cancer Maternal Aunt   . Breast cancer Maternal Grandmother     Social History   Tobacco Use  . Smoking status: Former Smoker    Types: Cigarettes  . Smokeless tobacco: Never Used  Substance Use Topics  . Alcohol use: Yes    Comment: socially  . Drug use: No    Frequency: 5.0 times per week    Types: Marijuana    Home Medications Prior to Admission medications   Medication Sig Start Date End Date Taking? Authorizing Provider  acetaminophen (TYLENOL) 500  MG tablet Take 500 mg by mouth every 6 (six) hours as needed for mild pain, moderate pain, fever or headache.   Yes [provider]  OVER THE COUNTER MEDICATION Take 15 mLs by mouth daily. Apetamin   Yes [provider]  naproxen (NAPROSYN) 500 MG tablet Take 1 tablet (500 mg total) by mouth 2 (two) times daily with a meal. Patient not taking: Reported on 05/11/2017 11/18/16   Roxy Horseman, PA-C  ondansetron (ZOFRAN ODT) 4 MG disintegrating tablet Take 1 tablet (4 mg total) by mouth every 8 (eight) hours as needed for nausea or vomiting. Patient not taking: Reported on 06/04/2016 03/10/16   Sharman Cheek, MD    Allergies    Tramadol  Review of Systems   Review of Systems  Constitutional: Negative for chills and fever.  HENT: Negative for congestion.   Eyes: Negative for visual disturbance.  Respiratory: Negative for cough, chest tightness and shortness of breath.   Cardiovascular: Positive for chest pain (Anterior right-sided, radiating into right arm.). Negative for palpitations and leg swelling.  Gastrointestinal: Negative for abdominal pain, diarrhea, nausea and vomiting.  Genitourinary: Negative for dysuria and urgency.  Musculoskeletal: Negative for back pain and neck pain.  Skin: Negative for color change and rash.  Neurological: Negative  for weakness and headaches.  Psychiatric/Behavioral: Negative for confusion.  All other systems reviewed and are negative.   Physical Exam Updated Vital Signs BP 116/71 (BP Location: Right Arm)   Pulse 77   Temp 98.1 F (36.7 C) (Oral)   Resp 16   Ht 6' (1.829 m)   Wt 72.6 kg   LMP 06/13/2019 Comment: pt shielded  SpO2 100%   BMI 21.70 kg/m   Physical Exam Vitals and nursing note reviewed.  Constitutional:      General: She is not in acute distress.    Appearance: She is well-developed.  HENT:     Head: Normocephalic and atraumatic.  Eyes:     Conjunctiva/sclera: Conjunctivae normal.  Cardiovascular:      Rate and Rhythm: Normal rate and regular rhythm.     Pulses:          Radial pulses are 2+ on the right side and 2+ on the left side.       Dorsalis pedis pulses are 2+ on the right side and 2+ on the left side.       Posterior tibial pulses are 2+ on the right side and 2+ on the left side.     Heart sounds: Normal heart sounds. No murmur.  Pulmonary:     Effort: Pulmonary effort is normal. No respiratory distress.     Breath sounds: Normal breath sounds. No decreased breath sounds or wheezing.  Chest:     Chest wall: Tenderness (There is tenderness to palpation over the right lateral anterior chest superior to the breast.  Palpation here both recreates and exacerbates her reported pain.  She also has pain exacerbated in recreated with abduction of the right arm.) present. No mass.  Abdominal:     Palpations: Abdomen is soft.     Tenderness: There is no abdominal tenderness.  Musculoskeletal:     Cervical back: Neck supple.     Right lower leg: No tenderness. No edema.     Left lower leg: No tenderness. No edema.  Skin:    General: Skin is warm and dry.  Neurological:     General: No focal deficit present.     Mental Status: She is alert.  Psychiatric:        Mood and Affect: Mood normal.        Behavior: Behavior normal.     ED Results / Procedures / Treatments   Labs (all labs ordered are listed, but only abnormal results are displayed) Labs Reviewed  BASIC METABOLIC PANEL - Abnormal; Notable for the following components:      Result Value   Glucose, Bld 103 (*)    All other components within normal limits  CBC - Abnormal; Notable for the following components:   WBC 11.9 (*)    RBC 3.63 (*)    Hemoglobin 11.0 (*)    HCT 34.6 (*)    All other components within normal limits  I-STAT BETA HCG BLOOD, ED ([facility], [facility], [facility] ONLY)  TROPONIN I (HIGH SENSITIVITY)  TROPONIN I (HIGH SENSITIVITY)    EKG EKG Interpretation  Date/Time:  Friday June 29 2019 22:01:21 EDT Ventricular  Rate:  72 PR Interval:  200 QRS Duration: 86 QT Interval:  380 QTC Calculation: 416 R Axis:   81 Text Interpretation: Normal sinus rhythm Normal ECG When comapred to prior, no significant changes seen. No sTEMI Confirmed by Theda Belfast ([id number]) on 06/29/2019 10:25:29 PM   Radiology DG Chest 2 View  Result Date: 06/29/2019  CLINICAL DATA:  Chest pain EXAM: CHEST - 2 VIEW COMPARISON:  06/04/2016 FINDINGS: The heart size and mediastinal contours are within normal limits. Both lungs are clear. The visualized skeletal structures are unremarkable. IMPRESSION: No active cardiopulmonary disease. Electronically Signed   By: Donavan Foil M.D.   On: 06/29/2019 22:26    Procedures Procedures (including critical care time)  Medications Ordered in ED Medications  sodium chloride flush (NS) 0.9 % injection 3 mL (3 mLs Intravenous Not Given 06/29/19 2230)    ED Course  I have reviewed the triage vital signs and the nursing notes.  Pertinent labs & imaging results that were available during my care of the patient were reviewed by me and considered in my medical decision making (see chart for details).    MDM Rules/Calculators/A&P                     [facility] presents today for evaluation of right-sided chest pain.  Her pain is both recreated and exacerbated with palpation over the right anterior lateral chest and abduction of the arm including the radiation down her arm. Labs obtained by triage.  Chest x-ray without abnormalities.  Labs obtained and reviewed, no significant acute hematologic or electrolyte derangements.  Troponin was obtained and is not elevated.  PERC negative. I suspect musculoskeletal related chest pain.  Recommend conservative care including OTC ibuprofen, Tylenol.  Return precautions were discussed with patient who states their understanding.  At the time of discharge patient denied any unaddressed complaints or concerns.  Patient is agreeable for discharge home.  Note:  Portions of this report may have been transcribed using voice recognition software. Every effort was made to ensure accuracy; however, inadvertent computerized transcription errors may be present  Final Clinical Impression(s) / ED Diagnoses Final diagnoses:  Chest wall pain    Rx / DC Orders ED Discharge Orders    None       Ollen Gross 06/29/19 2348    Tegeler, Gwenyth Allegra, MD 07/02/19 [phone]

## 2019-06-29 NOTE — ED Triage Notes (Signed)
  Patient comes in with chest pain that has been going on for about a week.  Patient has R upper chest pain that radiates to R arm.  Sharp pain that is reproducible when palpating.  7/10. No SOB, or N/V.  Patient states she has been taking Apetamin that she ordered online to help her gain weight.  Has been taking it about a month.

## 2019-06-29 NOTE — Discharge Instructions (Addendum)
Please take Ibuprofen (Advil, motrin) and Tylenol (acetaminophen) to relieve your pain.  You may take up to 600 MG (3 pills) of normal strength ibuprofen every 8 hours as needed.  In between doses of ibuprofen you make take tylenol, up to 1,000 mg (two extra strength pills).  Do not take more than 3,000 mg tylenol in a 24 hour period.  Please check all medication labels as many medications such as pain and cold medications may contain tylenol.  Do not drink alcohol while taking these medications.  Do not take other NSAID'S while taking ibuprofen (such as aleve or naproxen).  Please take ibuprofen with food to decrease stomach upset.  Stop taking the weight gain supplement.

## 2020-02-06 ENCOUNTER — Other Ambulatory Visit: Payer: Self-pay

## 2020-02-06 ENCOUNTER — Emergency Department (HOSPITAL_COMMUNITY)
Admission: EM | Admit: 2020-02-06 | Discharge: 2020-02-07 | Disposition: A | Discharge status: Home / Self Care | Payer: Self-pay | Attending: Emergency Medicine | Admitting: Emergency Medicine

## 2020-02-06 ENCOUNTER — Emergency Department (HOSPITAL_COMMUNITY): Payer: Self-pay

## 2020-02-06 ENCOUNTER — Encounter (HOSPITAL_COMMUNITY): Payer: Self-pay

## 2020-02-06 DIAGNOSIS — R0602 Shortness of breath: Secondary | ICD-10-CM | POA: Insufficient documentation

## 2020-02-06 DIAGNOSIS — R059 Cough, unspecified: Secondary | ICD-10-CM

## 2020-02-06 DIAGNOSIS — M791 Myalgia, unspecified site: Secondary | ICD-10-CM | POA: Insufficient documentation

## 2020-02-06 DIAGNOSIS — Z87891 Personal history of nicotine dependence: Secondary | ICD-10-CM | POA: Insufficient documentation

## 2020-02-06 DIAGNOSIS — R0789 Other chest pain: Secondary | ICD-10-CM | POA: Insufficient documentation

## 2020-02-06 DIAGNOSIS — Z20822 Contact with and (suspected) exposure to covid-19: Secondary | ICD-10-CM | POA: Insufficient documentation

## 2020-02-06 DIAGNOSIS — R062 Wheezing: Secondary | ICD-10-CM | POA: Insufficient documentation

## 2020-02-06 LAB — RESPIRATORY PANEL BY RT PCR (FLU A&B, COVID)
Influenza A by PCR: NEGATIVE
Influenza B by PCR: NEGATIVE
SARS Coronavirus 2 by RT PCR: NEGATIVE

## 2020-02-06 NOTE — ED Triage Notes (Signed)
Pt has been having SOB, cough, wheezing, bodyaches, headache. Pt is unvaccinated.

## 2020-02-07 MED ORDER — ALBUTEROL SULFATE HFA 108 (90 BASE) MCG/ACT IN AERS
2.0000 | INHALATION_SPRAY | RESPIRATORY_TRACT | Status: DC | PRN
  Administered 2020-02-07: 2 via RESPIRATORY_TRACT
  Filled 2020-02-07: qty 6.7

## 2020-02-07 MED ORDER — BENZONATATE 100 MG PO CAPS: 100.0000 mg | ORAL_CAPSULE | Freq: Three times a day (TID) | ORAL | 0 refills | Status: AC

## 2020-02-07 NOTE — Discharge Instructions (Signed)
Your COVID and flu test was negative. No pneumonia was see on chest x-ray.  Take cough medicine as directed.  Also consider taking Zyrtec or Claritin.  Use inhaler as needed.  Return for new or worsening symptoms.

## 2020-02-07 NOTE — ED Provider Notes (Signed)
[facility] EMERGENCY DEPARTMENT Provider Note   CSN: [id number] Arrival date & time: 02/06/20  2248     History Chief Complaint  Patient presents with  . Shortness of Breath    Yolanda Wall is a 29 y.o. female.  Patient presents to the emergency department with a chief complaint of cough, body aches, wheezing, and some chest tightness/shortness of breath.  She states that she has history of bronchitis.  She has been feeling sick for the past few days.  She denies any successful treatments prior to arrival.  She is unvaccinated against Covid-19.  She states that she is concerned about Covid.  She denies any other associated symptoms.  The history is provided by the patient. No language interpreter was used.       Past Medical History:  Diagnosis Date  . Anxiety   . Bipolar 1 disorder (HCC)   . Depression   . Migraines   . Ovarian cyst     There are no problems to display for this patient.   History reviewed. No pertinent surgical history.   OB History   No obstetric history on file.     Family History  Problem Relation Age of Onset  . Ovarian cancer Mother   . Ovarian cancer Maternal Aunt   . Breast cancer Maternal Grandmother     Social History   Tobacco Use  . Smoking status: Former Smoker    Types: Cigarettes  . Smokeless tobacco: Never Used  Substance Use Topics  . Alcohol use: Yes    Comment: socially  . Drug use: No    Frequency: 5.0 times per week    Types: Marijuana    Home Medications Prior to Admission medications   Medication Sig Start Date End Date Taking? Authorizing Provider  acetaminophen (TYLENOL) 500 MG tablet Take 500 mg by mouth every 6 (six) hours as needed for mild pain, moderate pain, fever or headache.    [provider]  benzonatate (TESSALON) 100 MG capsule Take 1 capsule (100 mg total) by mouth every 8 (eight) hours. 02/07/20   Roxy Horseman, PA-C  naproxen (NAPROSYN) 500 MG tablet Take 1  tablet (500 mg total) by mouth 2 (two) times daily with a meal. Patient not taking: Reported on 05/11/2017 11/18/16   Roxy Horseman, PA-C  ondansetron (ZOFRAN ODT) 4 MG disintegrating tablet Take 1 tablet (4 mg total) by mouth every 8 (eight) hours as needed for nausea or vomiting. Patient not taking: Reported on 06/04/2016 03/10/16   Sharman Cheek, MD  OVER THE COUNTER MEDICATION Take 15 mLs by mouth daily. Apetamin    [provider]    Allergies    Tramadol  Review of Systems   Review of Systems  All other systems reviewed and are negative.   Physical Exam Updated Vital Signs BP 121/68 (BP Location: Left Arm)   Pulse 84   Temp 99.1 F (37.3 C) (Oral)   Resp 16   SpO2 99%   Physical Exam Vitals and nursing note reviewed.  Constitutional:      General: She is not in acute distress.    Appearance: She is well-developed.  HENT:     Head: Normocephalic and atraumatic.  Eyes:     Conjunctiva/sclera: Conjunctivae normal.  Cardiovascular:     Rate and Rhythm: Normal rate and regular rhythm.     Heart sounds: No murmur heard.   Pulmonary:     Effort: Pulmonary effort is normal. No respiratory distress.  Breath sounds: Wheezing present.     Comments: Mild wheezing Abdominal:     Palpations: Abdomen is soft.     Tenderness: There is no abdominal tenderness.  Musculoskeletal:     Cervical back: Neck supple.  Skin:    General: Skin is warm and dry.  Neurological:     Mental Status: She is alert and oriented to person, place, and time.  Psychiatric:        Mood and Affect: Mood normal.        Behavior: Behavior normal.     ED Results / Procedures / Treatments   Labs (all labs ordered are listed, but only abnormal results are displayed) Labs Reviewed  RESPIRATORY PANEL BY RT PCR (FLU A&B, COVID)    EKG EKG Interpretation  Date/Time:  Wednesday February 06 2020 23:07:14 EST Ventricular Rate:  80 PR Interval:  204 QRS Duration: 82 QT  Interval:  368 QTC Calculation: 424 R Axis:   78 Text Interpretation: Normal sinus rhythm Normal ECG When compared with ECG of 06/29/2019, No significant change was found Confirmed by Dione Booze ([id number]) on 02/06/2020 11:21:34 PM   Radiology DG Chest Portable 1 View  Result Date: 02/06/2020 CLINICAL DATA:  Shortness of breath EXAM: PORTABLE CHEST 1 VIEW COMPARISON:  None. FINDINGS: The heart size and mediastinal contours are within normal limits. Both lungs are clear. The visualized skeletal structures are unremarkable. IMPRESSION: No active disease. Electronically Signed   By: Jonna Clark M.D.   On: 02/06/2020 23:21    Procedures Procedures (including critical care time)  Medications Ordered in ED Medications  albuterol (VENTOLIN HFA) 108 (90 Base) MCG/ACT inhaler 2 puff (has no administration in time range)    ED Course  I have reviewed the triage vital signs and the nursing notes.  Pertinent labs & imaging results that were available during my care of the patient were reviewed by me and considered in my medical decision making (see chart for details).    MDM Rules/Calculators/A&P                          Yolanda Wall was evaluated in Emergency Department on 02/07/2020 for the symptoms described in the history of present illness. She was evaluated in the context of the global COVID-19 pandemic, which necessitated consideration that the patient might be at risk for infection with the SARS-CoV-2 virus that causes COVID-19. Institutional protocols and algorithms that pertain to the evaluation of patients at risk for COVID-19 are in a state of rapid change based on information released by regulatory bodies including the CDC and federal and state organizations. These policies and algorithms were followed during the patient's care in the ED.  Pt CXR negative for acute infiltrate.  COVID and flu negative. Patients symptoms are consistent with URI, likely viral etiology. Discussed that  antibiotics are not indicated for viral infections. Pt will be discharged with symptomatic treatment.  Verbalizes understanding and is agreeable with plan. Pt is hemodynamically stable & in NAD prior to dc.  Final Clinical Impression(s) / ED Diagnoses Final diagnoses:  Cough  Wheezing    Rx / DC Orders ED Discharge Orders         Ordered    benzonatate (TESSALON) 100 MG capsule  Every 8 hours        02/07/20 0405           Roxy Horseman, PA-C 02/07/20 0409    Dione Booze, MD 02/07/20 [phone]

## 2020-06-10 ENCOUNTER — Encounter (HOSPITAL_COMMUNITY): Payer: Self-pay | Admitting: Emergency Medicine

## 2020-06-10 ENCOUNTER — Other Ambulatory Visit: Payer: Self-pay

## 2020-06-10 ENCOUNTER — Emergency Department (HOSPITAL_COMMUNITY)
Admission: EM | Admit: 2020-06-10 | Discharge: 2020-06-10 | Disposition: A | Discharge status: Home / Self Care | Payer: BC Managed Care – PPO | Attending: Emergency Medicine | Admitting: Emergency Medicine

## 2020-06-10 DIAGNOSIS — N939 Abnormal uterine and vaginal bleeding, unspecified: Secondary | ICD-10-CM | POA: Diagnosis not present

## 2020-06-10 DIAGNOSIS — Z87891 Personal history of nicotine dependence: Secondary | ICD-10-CM | POA: Diagnosis not present

## 2020-06-10 DIAGNOSIS — N946 Dysmenorrhea, unspecified: Secondary | ICD-10-CM | POA: Diagnosis not present

## 2020-06-10 MED ORDER — METHOCARBAMOL 500 MG PO TABS
750.0000 mg | ORAL_TABLET | Freq: Once | ORAL | Status: AC
  Administered 2020-06-10: 750 mg via ORAL
  Filled 2020-06-10: qty 2

## 2020-06-10 MED ORDER — KETOROLAC TROMETHAMINE 15 MG/ML IJ SOLN
60.0000 mg | Freq: Once | INTRAMUSCULAR | Status: AC
  Administered 2020-06-10: 60 mg via INTRAMUSCULAR
  Filled 2020-06-10: qty 4

## 2020-06-10 MED ORDER — METHOCARBAMOL 750 MG PO TABS: 750.0000 mg | ORAL_TABLET | Freq: Four times a day (QID) | ORAL | 0 refills | Status: AC

## 2020-06-10 NOTE — ED Triage Notes (Signed)
C/o menstrual cramping since yesterday.

## 2020-06-10 NOTE — ED Provider Notes (Signed)
[facility] EMERGENCY DEPARTMENT Provider Note   CSN: [id number] Arrival date & time: 06/10/20  4332     History Chief Complaint  Patient presents with  . Abdominal Cramping    Yolanda Wall is a 30 y.o. female.  30 year old female presents with lower abdominal discomfort consistent with menstrual cramps.  Patient states she has a history of fibroids as well as ovarian cyst and this feels similar.  Denies any fever or chills.  States that she has had moderate vaginal bleeding.  Notes that this is the time when she normally has her menstrual cycle.  Denies any fever or chills.  No nausea or vomiting.  Has used ibuprofen without relief        Past Medical History:  Diagnosis Date  . Anxiety   . Bipolar 1 disorder (HCC)   . Depression   . Migraines   . Ovarian cyst     There are no problems to display for this patient.   History reviewed. No pertinent surgical history.   OB History   No obstetric history on file.     Family History  Problem Relation Age of Onset  . Ovarian cancer Mother   . Ovarian cancer Maternal Aunt   . Breast cancer Maternal Grandmother     Social History   Tobacco Use  . Smoking status: Former Smoker    Types: Cigarettes  . Smokeless tobacco: Never Used  Substance Use Topics  . Alcohol use: Yes    Comment: socially  . Drug use: No    Frequency: 5.0 times per week    Types: Marijuana    Home Medications Prior to Admission medications   Medication Sig Start Date End Date Taking? Authorizing Provider  acetaminophen (TYLENOL) 500 MG tablet Take 500 mg by mouth every 6 (six) hours as needed for mild pain, moderate pain, fever or headache.    [provider]  benzonatate (TESSALON) 100 MG capsule Take 1 capsule (100 mg total) by mouth every 8 (eight) hours. 02/07/20   Roxy Horseman, PA-C  naproxen (NAPROSYN) 500 MG tablet Take 1 tablet (500 mg total) by mouth 2 (two) times daily with a meal. Patient not  taking: Reported on 05/11/2017 11/18/16   Roxy Horseman, PA-C  ondansetron (ZOFRAN ODT) 4 MG disintegrating tablet Take 1 tablet (4 mg total) by mouth every 8 (eight) hours as needed for nausea or vomiting. Patient not taking: Reported on 06/04/2016 03/10/16   Sharman Cheek, MD  OVER THE COUNTER MEDICATION Take 15 mLs by mouth daily. Apetamin    [provider]    Allergies    Tramadol  Review of Systems   Review of Systems  All other systems reviewed and are negative.   Physical Exam Updated Vital Signs BP 117/71 (BP Location: Left Arm)   Pulse 77   Temp 98.1 F (36.7 C) (Oral)   Resp 16   LMP 06/09/2020   SpO2 100%   Physical Exam Vitals and nursing note reviewed.  Constitutional:      General: She is not in acute distress.    Appearance: Normal appearance. She is well-developed. She is not toxic-appearing.  HENT:     Head: Normocephalic and atraumatic.  Eyes:     General: Lids are normal.     Conjunctiva/sclera: Conjunctivae normal.     Pupils: Pupils are equal, round, and reactive to light.  Neck:     Thyroid: No thyroid mass.     Trachea: No tracheal deviation.  Cardiovascular:     Rate and Rhythm: Normal rate and regular rhythm.     Heart sounds: Normal heart sounds. No murmur heard. No gallop.   Pulmonary:     Effort: Pulmonary effort is normal. No respiratory distress.     Breath sounds: Normal breath sounds. No stridor. No decreased breath sounds, wheezing, rhonchi or rales.  Abdominal:     General: Bowel sounds are normal. There is no distension.     Palpations: Abdomen is soft.     Tenderness: There is no abdominal tenderness. There is no guarding or rebound.  Musculoskeletal:        General: No tenderness. Normal range of motion.     Cervical back: Normal range of motion and neck supple.  Skin:    General: Skin is warm and dry.     Findings: No abrasion or rash.  Neurological:     Mental Status: She is alert and oriented to person,  place, and time.     GCS: GCS eye subscore is 4. GCS verbal subscore is 5. GCS motor subscore is 6.     Cranial Nerves: No cranial nerve deficit.     Sensory: No sensory deficit.  Psychiatric:        Speech: Speech normal.        Behavior: Behavior normal.     ED Results / Procedures / Treatments   Labs (all labs ordered are listed, but only abnormal results are displayed) Labs Reviewed - No data to display  EKG None  Radiology No results found.  Procedures Procedures   Medications Ordered in ED Medications  ketorolac (TORADOL) 15 MG/ML injection 60 mg (has no administration in time range)  methocarbamol (ROBAXIN) tablet 750 mg (has no administration in time range)    ED Course  I have reviewed the triage vital signs and the nursing notes.  Pertinent labs & imaging results that were available during my care of the patient were reviewed by me and considered in my medical decision making (see chart for details).    MDM Rules/Calculators/A&P                          Patient medicated here and feels better.  Will discharge Final Clinical Impression(s) / ED Diagnoses Final diagnoses:  None    Rx / DC Orders ED Discharge Orders    None       Lorre Nick, MD 06/10/20 [phone]

## 2020-06-10 NOTE — ED Notes (Signed)
Patient verbalizes understanding of discharge instructions. Opportunity for questioning and answers were provided. Armband removed by staff, pt discharged from ED.  

## 2021-04-06 ENCOUNTER — Other Ambulatory Visit (HOSPITAL_BASED_OUTPATIENT_CLINIC_OR_DEPARTMENT_OTHER): Payer: Self-pay

## 2021-04-06 ENCOUNTER — Other Ambulatory Visit: Payer: Self-pay

## 2021-04-06 ENCOUNTER — Ambulatory Visit: Payer: Self-pay | Attending: Internal Medicine

## 2021-04-06 DIAGNOSIS — Z23 Encounter for immunization: Secondary | ICD-10-CM

## 2021-04-06 MED ORDER — PFIZER-BIONT COVID-19 VAC-TRIS 30 MCG/0.3ML IM SUSP
INTRAMUSCULAR | 0 refills | Status: AC
  Filled 2021-04-06: qty 0.3, 1d supply, fill #0

## 2021-04-06 NOTE — Progress Notes (Signed)
° °  Covid-19 Vaccination Clinic  Name:  Yolanda Wall    MRN: [id number] DOB: Nov 24, 1990  04/06/2021  Yolanda Wall was observed post Covid-19 immunization for 15 minutes without incident. She was provided with Vaccine Information Sheet and instruction to access the V-Safe system.   Yolanda Wall was instructed to call 911 with any severe reactions post vaccine: Difficulty breathing  Swelling of face and throat  A fast heartbeat  A bad rash all over body  Dizziness and weakness   Immunizations Administered     Name Date Dose VIS Date Route   PFIZER Comrnaty(Gray TOP) Covid-19 Vaccine 04/06/2021 10:23 AM 0.3 mL 11/26/2020 Intramuscular   Manufacturer: [employer], [employer]   Lot: [id number]   NDC: [phone]

## 2021-05-19 ENCOUNTER — Ambulatory Visit: Payer: Self-pay | Attending: Internal Medicine

## 2021-05-19 ENCOUNTER — Other Ambulatory Visit (HOSPITAL_BASED_OUTPATIENT_CLINIC_OR_DEPARTMENT_OTHER): Payer: Self-pay

## 2021-05-19 DIAGNOSIS — Z23 Encounter for immunization: Secondary | ICD-10-CM

## 2021-05-19 MED ORDER — COVID-19 MRNA VACCINE (PFIZER) 30 MCG/0.3ML IM SUSP
INTRAMUSCULAR | 0 refills | Status: AC
  Filled 2021-05-19: qty 0.3, 1d supply, fill #0

## 2021-05-19 NOTE — Progress Notes (Signed)
° °  Covid-19 Vaccination Clinic  Name:  Yolanda Wall    MRN: [id number] DOB: Jan 25, 1991  05/19/2021  [name] was observed post Covid-19 immunization for 15 minutes without incident. She was provided with Vaccine Information Sheet and instruction to access the V-Safe system.   [name] was instructed to call 911 with any severe reactions post vaccine: Difficulty breathing  Swelling of face and throat  A fast heartbeat  A bad rash all over body  Dizziness and weakness   Immunizations Administered     Name Date Dose VIS Date Route   PFIZER Comrnaty(Gray TOP) Covid-19 Vaccine 05/19/2021  1:41 PM 0.3 mL 11/26/2020 Intramuscular   Manufacturer: [employer], [employer]   Lot: [id number]   NDC: [phone]

## 2021-06-05 IMAGING — DX DG CHEST 1V PORT
1 series · 1 of 1 positions shown · non-contrast
Comparison: None.

CLINICAL DATA: Shortness of breath

EXAM:
PORTABLE CHEST 1 VIEW

[chest ap]
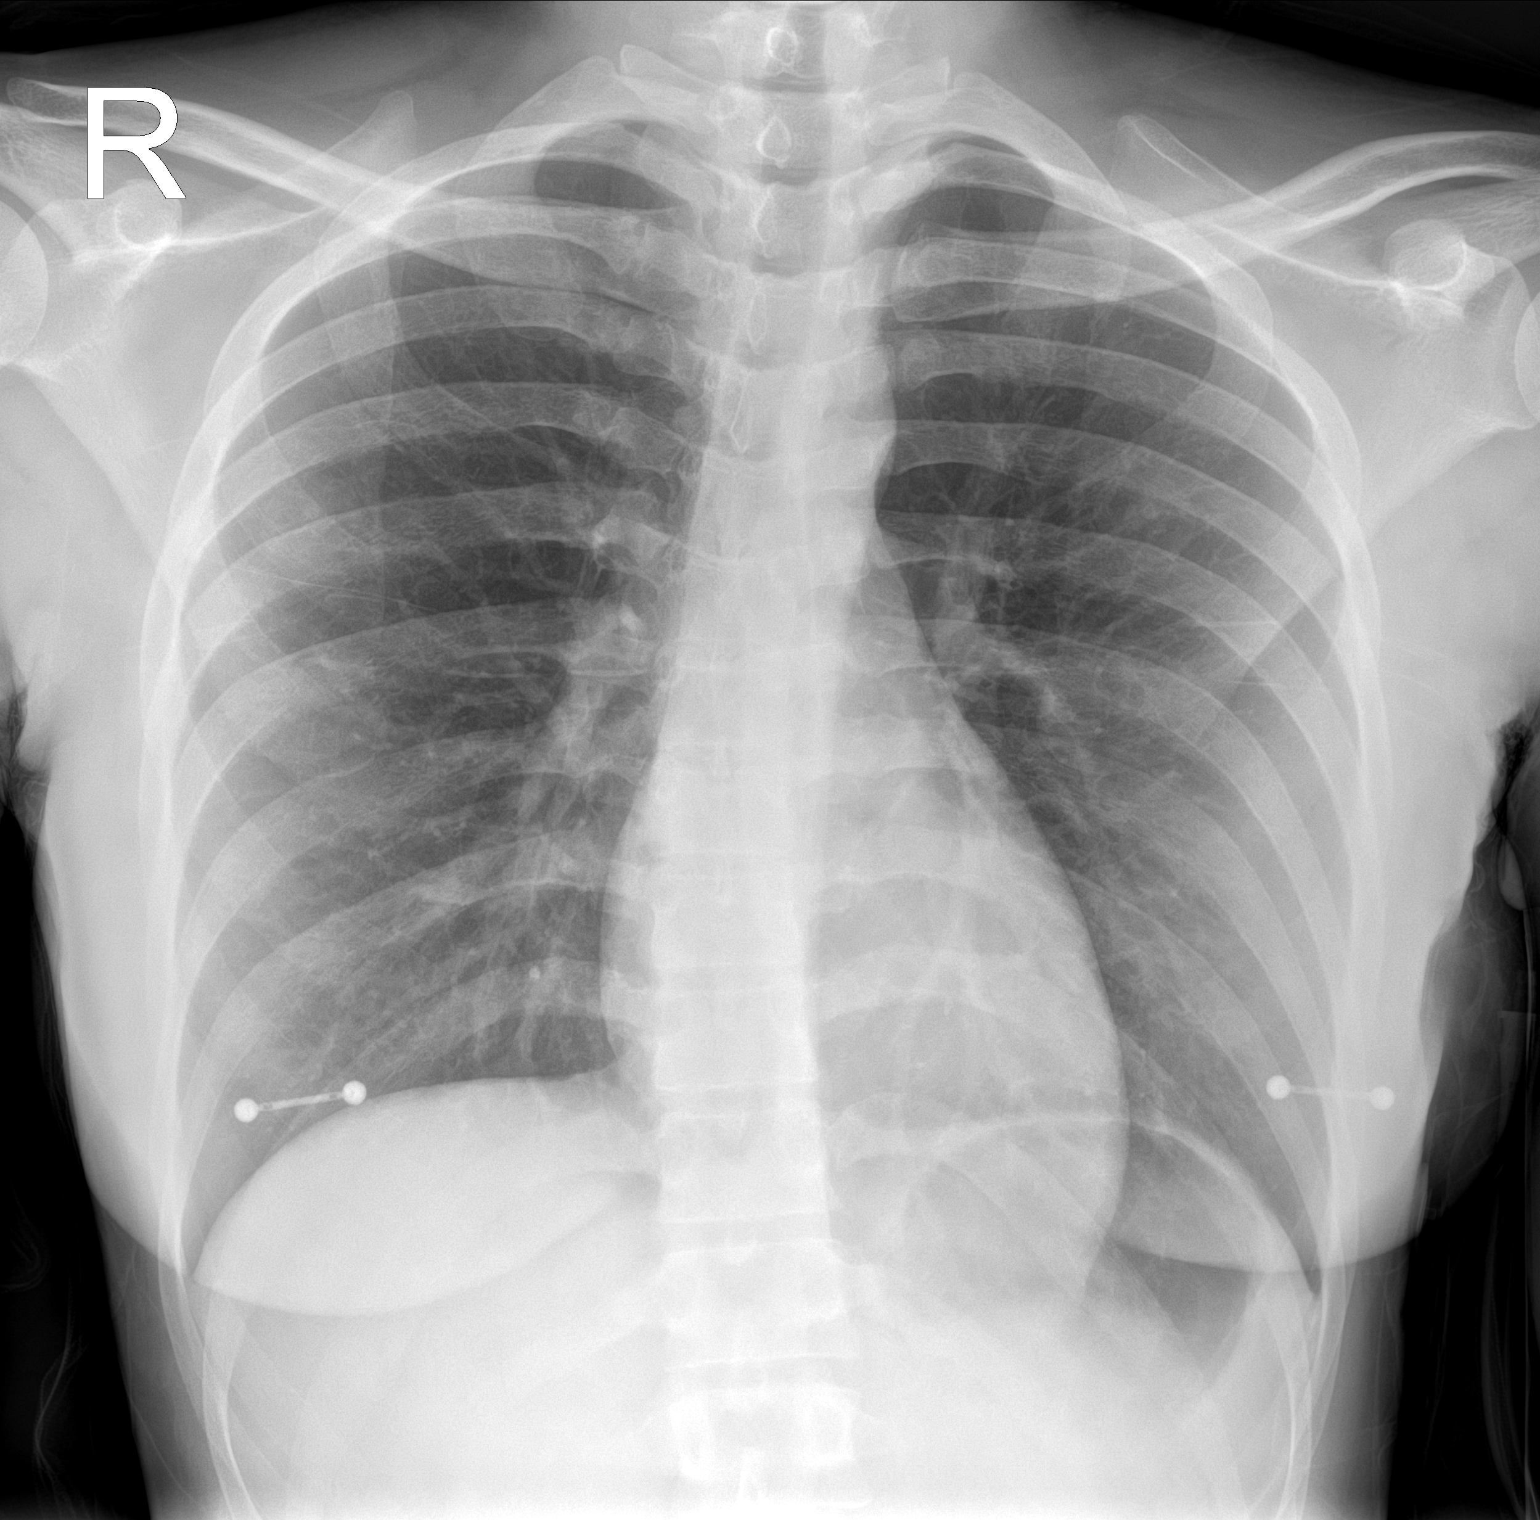

[1 of 1 positions shown; findings below may reference images not displayed]

FINDINGS: The heart size and mediastinal contours are within normal limits.
Both lungs are clear. The visualized skeletal structures are
unremarkable.
IMPRESSION: No active disease.

## 2021-06-17 ENCOUNTER — Other Ambulatory Visit (HOSPITAL_BASED_OUTPATIENT_CLINIC_OR_DEPARTMENT_OTHER): Payer: Self-pay

## 2021-06-17 ENCOUNTER — Encounter (HOSPITAL_BASED_OUTPATIENT_CLINIC_OR_DEPARTMENT_OTHER): Payer: Self-pay

## 2021-06-17 ENCOUNTER — Emergency Department (HOSPITAL_BASED_OUTPATIENT_CLINIC_OR_DEPARTMENT_OTHER)
Admission: EM | Admit: 2021-06-17 | Discharge: 2021-06-17 | Disposition: A | Discharge status: Home / Self Care | Payer: Self-pay | Attending: Emergency Medicine | Admitting: Emergency Medicine

## 2021-06-17 ENCOUNTER — Other Ambulatory Visit: Payer: Self-pay

## 2021-06-17 DIAGNOSIS — N898 Other specified noninflammatory disorders of vagina: Secondary | ICD-10-CM

## 2021-06-17 DIAGNOSIS — L292 Pruritus vulvae: Secondary | ICD-10-CM | POA: Insufficient documentation

## 2021-06-17 DIAGNOSIS — Z113 Encounter for screening for infections with a predominantly sexual mode of transmission: Secondary | ICD-10-CM

## 2021-06-17 DIAGNOSIS — Z202 Contact with and (suspected) exposure to infections with a predominantly sexual mode of transmission: Secondary | ICD-10-CM | POA: Insufficient documentation

## 2021-06-17 LAB — WET PREP, GENITAL
Clue Cells Wet Prep HPF POC: NONE SEEN
Sperm: NONE SEEN
Trich, Wet Prep: NONE SEEN
WBC, Wet Prep HPF POC: >=10 — AB (ref <10)
Yeast Wet Prep HPF POC: NONE SEEN

## 2021-06-17 LAB — RAPID HIV SCREEN (HIV 1/2 AB+AG)
HIV 1/2 Antibodies: NONREACTIVE
HIV-1 P24 Antigen - HIV24: NONREACTIVE

## 2021-06-17 MED ORDER — LIDOCAINE HCL (PF) 1 % IJ SOLN
INTRAMUSCULAR | Status: AC
  Administered 2021-06-17: 1 mL
  Filled 2021-06-17: qty 5

## 2021-06-17 MED ORDER — CEFTRIAXONE SODIUM 500 MG IJ SOLR
500.0000 mg | Freq: Once | INTRAMUSCULAR | Status: AC
  Administered 2021-06-17: 500 mg via INTRAMUSCULAR
  Filled 2021-06-17: qty 500

## 2021-06-17 MED ORDER — DOXYCYCLINE HYCLATE 100 MG PO CAPS
100.0000 mg | ORAL_CAPSULE | Freq: Two times a day (BID) | ORAL | 0 refills | Status: AC
  Filled 2021-06-17: qty 14, 7d supply, fill #0

## 2021-06-17 NOTE — ED Notes (Signed)
Patient verbalizes understanding of discharge instructions. Opportunity for questioning and answers were provided. Armband removed by staff, pt discharged from ED and ambulated to lobby to return home.   

## 2021-06-17 NOTE — ED Provider Notes (Signed)
?[facility] EMERGENCY DEPT ?Provider Note ? ? ?CSN: [id number] ?Arrival date & time: 06/17/21  1002 ? ?  ? ?History ? ?Chief Complaint  ?Patient presents with  ? Vaginal Itching  ? ? ?Yolanda Wall is a 31 y.o. female. ? ? ?Vaginal Itching ? ? ?31 year old female who presents to the emergency department with a chief complaint of vaginal discomfort.  She states that she has had some increased vaginal discharge.  She endorses a change in the significant for vagina.  She denies any vaginal bleeding.  She requests STI testing.  She denies any abdominal pain, fevers, chills.  She denies any vaginal lesions, dysuria, increased urinary frequency.  She denies any pelvic pain. ? ?Home Medications ?Prior to Admission medications   ?Medication Sig Start Date End Date Taking? Authorizing Provider  ?doxycycline (VIBRAMYCIN) 100 MG capsule Take 1 capsule (100 mg total) by mouth 2 (two) times daily for 7 days. 06/17/21 06/24/21 Yes Ernie Avena, MD  ?acetaminophen (TYLENOL) 500 MG tablet Take 500 mg by mouth every 6 (six) hours as needed for mild pain, moderate pain, fever or headache.    [provider]  ?benzonatate (TESSALON) 100 MG capsule Take 1 capsule (100 mg total) by mouth every 8 (eight) hours. ?Patient not taking: Reported on 06/17/2021 02/07/20   Roxy Horseman, PA-C  ?COVID-19 mRNA Vac-TriS, Pfizer, (PFIZER-BIONT COVID-19 VAC-TRIS) SUSP injection Inject into the muscle. 04/06/21   Judyann Munson, MD  ?COVID-19 mRNA vaccine, Pfizer, 30 MCG/0.3ML injection Inject into the muscle. 05/19/21   Judyann Munson, MD  ?methocarbamol (ROBAXIN-750) 750 MG tablet Take 1 tablet (750 mg total) by mouth 4 (four) times daily. 06/10/20   Lorre Nick, MD  ?naproxen (NAPROSYN) 500 MG tablet Take 1 tablet (500 mg total) by mouth 2 (two) times daily with a meal. ?Patient not taking: Reported on 05/11/2017 11/18/16   Roxy Horseman, PA-C  ?ondansetron (ZOFRAN ODT) 4 MG disintegrating tablet Take 1 tablet (4 mg  total) by mouth every 8 (eight) hours as needed for nausea or vomiting. ?Patient not taking: Reported on 06/04/2016 03/10/16   Sharman Cheek, MD  ?OVER THE COUNTER MEDICATION Take 15 mLs by mouth daily. Apetamin    [provider]  ?   ? ?Allergies    ?Tramadol   ? ?Review of Systems   ?Review of Systems  ?All other systems reviewed and are negative. ? ?Physical Exam ?Updated Vital Signs ?BP 120/72   Pulse 70   Temp 98.6 ?F (37 ?C) (Oral)   Resp 16   Ht 6' (1.829 m)   Wt 70.8 kg   LMP 05/29/2021 (Approximate)   SpO2 100%   BMI 21.16 kg/m?  ?Physical Exam ?Vitals and nursing note reviewed. Exam conducted with a chaperone present.  ?Constitutional:   ?   General: She is not in acute distress. ?   Appearance: She is well-developed.  ?HENT:  ?   Head: Normocephalic and atraumatic.  ?Eyes:  ?   Conjunctiva/sclera: Conjunctivae normal.  ?Cardiovascular:  ?   Rate and Rhythm: Normal rate and regular rhythm.  ?   Heart sounds: No murmur heard. ?Pulmonary:  ?   Effort: Pulmonary effort is normal. No respiratory distress.  ?   Breath sounds: Normal breath sounds.  ?Abdominal:  ?   Palpations: Abdomen is soft.  ?   Tenderness: There is no abdominal tenderness.  ?Genitourinary: ?   Labia:     ?   Right: No lesion.     ?   Left:  No lesion.   ?   Urethra: No urethral lesion.  ?   Cervix: Cervical motion tenderness present.  ?   Adnexa: Right adnexa normal and left adnexa normal.    ?   Right: No tenderness.      ?   Left: No tenderness.    ?Musculoskeletal:     ?   General: No swelling.  ?   Cervical back: Neck supple.  ?Skin: ?   General: Skin is warm and dry.  ?   Capillary Refill: Capillary refill takes less than 2 seconds.  ?Neurological:  ?   Mental Status: She is alert.  ?Psychiatric:     ?   Mood and Affect: Mood normal.  ? ? ?ED Results / Procedures / Treatments   ?Labs ?(all labs ordered are listed, but only abnormal results are displayed) ?Labs Reviewed  ?WET PREP, GENITAL - Abnormal; Notable for the  following components:  ?    Result Value  ? WBC, Wet Prep HPF POC >=10 (*)   ? All other components within normal limits  ?RAPID HIV SCREEN (HIV 1/2 AB+AG)  ?RPR  ?GC/CHLAMYDIA PROBE AMP ([place]) NOT AT [facility]  ? ? ?EKG ?None ? ?Radiology ?No results found. ? ?Procedures ?Procedures  ? ? ?Medications Ordered in ED ?Medications  ?cefTRIAXone (ROCEPHIN) injection 500 mg (500 mg Intramuscular Given 06/17/21 1307)  ?lidocaine (PF) (XYLOCAINE) 1 % injection (1 mL  Given 06/17/21 1307)  ? ? ?ED Course/ Medical Decision Making/ A&P ?  ?                        ?Medical Decision Making ?Amount and/or Complexity of Data Reviewed ?Labs: ordered. ? ?Risk ?Prescription drug management. ? ? ?31 year old female who presents to the emergency department with a chief complaint of vaginal discomfort.  She states that she has had some increased vaginal discharge.  She endorses a change in the significant for vagina.  She denies any vaginal bleeding.  She requests STI testing.  She denies any abdominal pain, fevers, chills.  She denies any vaginal lesions, dysuria, increased urinary frequency.  She denies any pelvic pain.   ? ?Patient is requesting empiric treatment for STIs.  A wet prep was performed and was positive for WBCs, negative for yeast, trichomonas, clue cells.  GC/chlamydia testing was collected and negative.  An RPR was collected and negative, HIV also collected and negative.  Patient administered Rocephin and doxycycline and subsequently discharged. ? ? ?Final Clinical Impression(s) / ED Diagnoses ?Final diagnoses:  ?Vaginal itching  ?Routine screening for STI (sexually transmitted infection)  ? ? ?Rx / DC Orders ?ED Discharge Orders   ? ?      Ordered  ?  doxycycline (VIBRAMYCIN) 100 MG capsule  2 times daily       ? 06/17/21 1303  ? ?  ?  ? ?  ? ? ?  ?Ernie Avena, MD ?06/18/21 1151 ? ?

## 2021-06-17 NOTE — ED Notes (Signed)
RT Note: Pelvic Cart placed outside room #12. ?

## 2021-06-17 NOTE — ED Triage Notes (Signed)
Pt. States she is having vaginal discomfort. States she is having vaginal itching. Denies discharge outside the normal. States symptoms started 2 days ago.  ?

## 2021-06-18 LAB — GC/CHLAMYDIA PROBE AMP (~~LOC~~) NOT AT ARMC
Chlamydia: NEGATIVE
Comment: NEGATIVE
Comment: NORMAL
Neisseria Gonorrhea: NEGATIVE

## 2021-06-18 LAB — RPR: RPR Ser Ql: NONREACTIVE

## 2021-12-21 ENCOUNTER — Emergency Department (HOSPITAL_COMMUNITY): Payer: Self-pay

## 2021-12-21 ENCOUNTER — Encounter (HOSPITAL_COMMUNITY): Payer: Self-pay

## 2021-12-21 ENCOUNTER — Other Ambulatory Visit: Payer: Self-pay

## 2021-12-21 ENCOUNTER — Emergency Department (HOSPITAL_COMMUNITY)
Admission: EM | Admit: 2021-12-21 | Discharge: 2021-12-22 | Disposition: A | Discharge status: Home / Self Care | Payer: Self-pay | Attending: Emergency Medicine | Admitting: Emergency Medicine

## 2021-12-21 DIAGNOSIS — R0789 Other chest pain: Secondary | ICD-10-CM | POA: Insufficient documentation

## 2021-12-21 LAB — CBC
HCT: 33.8 % — ABNORMAL LOW (ref 36.0–46.0)
Hemoglobin: 11 g/dL — ABNORMAL LOW (ref 12.0–15.0)
MCH: 30 pg (ref 26.0–34.0)
MCHC: 32.5 g/dL (ref 30.0–36.0)
MCV: 92.1 fL (ref 80.0–100.0)
Platelets: 219 10*3/uL (ref 150–400)
RBC: 3.67 MIL/uL — ABNORMAL LOW (ref 3.87–5.11)
RDW: 14.2 % (ref 11.5–15.5)
WBC: 10.3 10*3/uL (ref 4.0–10.5)
nRBC: 0 % (ref 0.0–0.2)

## 2021-12-21 LAB — I-STAT BETA HCG BLOOD, ED (MC, WL, AP ONLY): I-stat hCG, quantitative: <5 m[IU]/mL (ref <5)

## 2021-12-21 LAB — BASIC METABOLIC PANEL
Anion gap: 8 (ref 5–15)
BUN: 11 mg/dL (ref 6–20)
CO2: 22 mmol/L (ref 22–32)
Calcium: 9.5 mg/dL (ref 8.9–10.3)
Chloride: 105 mmol/L (ref 98–111)
Creatinine, Ser: 0.65 mg/dL (ref 0.44–1.00)
GFR, Estimated: >60 mL/min (ref >60)
Glucose, Bld: 95 mg/dL (ref 70–99)
Potassium: 3.3 mmol/L — ABNORMAL LOW (ref 3.5–5.1)
Sodium: 135 mmol/L (ref 135–145)

## 2021-12-21 LAB — TROPONIN I (HIGH SENSITIVITY): Troponin I (High Sensitivity): <2 ng/L (ref <18)

## 2021-12-21 LAB — LIPASE, BLOOD: Lipase: 25 U/L (ref 11–51)

## 2021-12-21 NOTE — ED Triage Notes (Signed)
Pt reports with left sided chest pain and right side pain that started a couple of days ago. Pt states that she works at [employer] and is unsure if she had pulled a muscle or not.

## 2021-12-21 NOTE — ED Provider Triage Note (Signed)
Emergency Medicine Provider Triage Evaluation Note  Yolanda Wall , a 31 y.o. female  was evaluated in triage.  Pt complains of left side CP for a few days, now with right axillary pain and mid chest is TTP. Has CP every couple of months. With nausea, SHOB while working in [employer].  Not on OCP, is a smoker- recently started vaping 5 days ago.  Hx maternal grandmother (lupus), paternal grandfather (DVT, PE) and dad (DVT) Review of Systems  Positive: CP, SHOB Negative: As above  Physical Exam  BP 119/64   Pulse 78   Temp 98.5 F (36.9 C) (Oral)   Resp 18   SpO2 100%  Gen:   Awake, no distress   Resp:  Normal effort  MSK:   Moves extremities without difficulty  Other:    Medical Decision Making  Medically screening exam initiated at 8:09 PM.  Appropriate orders placed.  Yolanda Wall was informed that the remainder of the evaluation will be completed by another provider, this initial triage assessment does not replace that evaluation, and the importance of remaining in the ED until their evaluation is complete.     Tacy Learn, PA-C 12/21/21 2011

## 2021-12-22 ENCOUNTER — Encounter (HOSPITAL_COMMUNITY): Payer: Self-pay | Admitting: Radiology

## 2021-12-22 ENCOUNTER — Emergency Department (HOSPITAL_COMMUNITY): Payer: Self-pay

## 2021-12-22 LAB — D-DIMER, QUANTITATIVE: D-Dimer, Quant: 0.81 ug/mL-FEU — ABNORMAL HIGH (ref 0.00–0.50)

## 2021-12-22 LAB — TROPONIN I (HIGH SENSITIVITY): Troponin I (High Sensitivity): <2 ng/L (ref <18)

## 2021-12-22 MED ORDER — KETOROLAC TROMETHAMINE 15 MG/ML IJ SOLN
15.0000 mg | Freq: Once | INTRAMUSCULAR | Status: AC
Start: 2021-12-22 — End: 2021-12-22
  Administered 2021-12-22: 15 mg via INTRAVENOUS
  Filled 2021-12-22: qty 1

## 2021-12-22 MED ORDER — IOHEXOL 350 MG/ML SOLN
100.0000 mL | Freq: Once | INTRAVENOUS | Status: AC | PRN
  Administered 2021-12-22: 56 mL via INTRAVENOUS

## 2021-12-22 MED ORDER — SODIUM CHLORIDE (PF) 0.9 % IJ SOLN
INTRAMUSCULAR | Status: AC
  Filled 2021-12-22: qty 50

## 2021-12-22 NOTE — Discharge Instructions (Addendum)
Take ibuprofen and Tylenol as needed for pain.  Follow-up with your primary care doctor as needed.  Return to the emergency department for any new or worsening symptoms of concern.

## 2021-12-22 NOTE — ED Provider Notes (Signed)
Care of patient assumed from Dr. Waverly Ferrari.  This patient presents for left-sided chest wall pain.  She does have a family history of VTE as well as a personal history of lupus.  She is currently awaiting a CTA of chest.  If negative, she can be discharged. Physical Exam  BP 114/78   Pulse 64   Temp 98.3 F (36.8 C) (Oral)   Resp 18   Ht 6' (1.829 m)   Wt 68.9 kg   SpO2 99%   BMI 20.61 kg/m   Physical Exam Vitals and nursing note reviewed.  Constitutional:      General: She is not in acute distress.    Appearance: She is well-developed and normal weight. She is not ill-appearing, toxic-appearing or diaphoretic.  HENT:     Head: Normocephalic and atraumatic.  Eyes:     Conjunctiva/sclera: Conjunctivae normal.  Neck:     Vascular: No JVD.  Cardiovascular:     Rate and Rhythm: Normal rate and regular rhythm.     Heart sounds: No murmur heard. Pulmonary:     Effort: Pulmonary effort is normal. No tachypnea or respiratory distress.  Abdominal:     Palpations: Abdomen is soft.     Tenderness: There is no abdominal tenderness.  Musculoskeletal:        General: No swelling. Normal range of motion.     Cervical back: Normal range of motion and neck supple.     Right lower leg: No edema.     Left lower leg: No edema.  Skin:    General: Skin is warm and dry.     Capillary Refill: Capillary refill takes less than 2 seconds.  Neurological:     General: No focal deficit present.     Mental Status: She is alert and oriented to person, place, and time.  Psychiatric:        Mood and Affect: Mood normal.        Behavior: Behavior normal.     Procedures  Procedures  ED Course / MDM    Medical Decision Making Amount and/or Complexity of Data Reviewed Labs: ordered. Radiology: ordered.  Risk Prescription drug management.   CT of chest showed no acute findings.  On assessment, patient resting comfortably.  Breathing is unlabored.  Vital signs are normal.  She was ordered Toradol  in the ED.  She declined any prescription medications for home.  She was discharged in good condition.       Godfrey Pick, MD 12/22/21 [phone]

## 2021-12-22 NOTE — ED Provider Notes (Signed)
[facility] DEPT Provider Note   CSN: [id number] Arrival date & time: 12/21/21  1916     History  Chief Complaint  Patient presents with   Chest Pain    Yolanda Wall is a 31 y.o. female.  Presents to the emergency department for evaluation of chest pain.  Patient reports that she has been experiencing pain across the center of her chest for a couple of days.  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  Patient also reports that she just started vaping this week and that seems to make the pain worse.  No associated cough, not short of breath.       Home Medications Prior to Admission medications   Medication Sig Start Date End Date Taking? Authorizing Provider  acetaminophen (TYLENOL) 500 MG tablet Take 500 mg by mouth every 6 (six) hours as needed for mild pain, moderate pain, fever or headache.   Yes [provider]  benzonatate (TESSALON) 100 MG capsule Take 1 capsule (100 mg total) by mouth every 8 (eight) hours. Patient not taking: Reported on 06/17/2021 02/07/20   Montine Circle, PA-C  COVID-19 mRNA Vac-TriS, Pfizer, (PFIZER-BIONT COVID-19 VAC-TRIS) SUSP injection Inject into the muscle. 04/06/21   Carlyle Basques, MD  COVID-19 mRNA vaccine, Pfizer, 30 MCG/0.3ML injection Inject into the muscle. 05/19/21   Carlyle Basques, MD  methocarbamol (ROBAXIN-750) 750 MG tablet Take 1 tablet (750 mg total) by mouth 4 (four) times daily. Patient not taking: Reported on 12/22/2021 06/10/20   Lacretia Leigh, MD  naproxen (NAPROSYN) 500 MG tablet Take 1 tablet (500 mg total) by mouth 2 (two) times daily with a meal. Patient not taking: Reported on 05/11/2017 11/18/16   Montine Circle, PA-C  ondansetron (ZOFRAN ODT) 4 MG disintegrating tablet Take 1 tablet (4 mg total) by mouth every 8 (eight) hours as needed for nausea or vomiting. Patient not taking: Reported on 06/04/2016  03/10/16   Carrie Mew, MD  OVER THE COUNTER MEDICATION Take 15 mLs by mouth daily. Apetamin Patient not taking: Reported on 12/22/2021    [provider]      Allergies    Tramadol    Review of Systems   Review of Systems  Physical Exam Updated Vital Signs BP 118/83   Pulse 67   Temp 98.1 F (36.7 C) (Oral)   Resp 18   Ht 6' (1.829 m)   Wt 68.9 kg   SpO2 100%   BMI 20.61 kg/m  Physical Exam Vitals and nursing note reviewed.  Constitutional:      General: She is not in acute distress.    Appearance: She is well-developed.  HENT:     Head: Normocephalic and atraumatic.     Mouth/Throat:     Mouth: Mucous membranes are moist.  Eyes:     General: Vision grossly intact. Gaze aligned appropriately.     Extraocular Movements: Extraocular movements intact.     Conjunctiva/sclera: Conjunctivae normal.  Cardiovascular:     Rate and Rhythm: Normal rate and regular rhythm.     Pulses: Normal pulses.     Heart sounds: Normal heart sounds, S1 normal and S2 normal. No murmur heard.    No friction rub. No gallop.  Pulmonary:     Effort: Pulmonary effort is normal. No respiratory distress.     Breath sounds: Normal breath sounds.  Chest:     Chest wall: Tenderness present.  Abdominal:  General: Bowel sounds are normal.     Palpations: Abdomen is soft.     Tenderness: There is no abdominal tenderness. There is no guarding or rebound.     Hernia: No hernia is present.  Musculoskeletal:        General: No swelling.     Cervical back: Full passive range of motion without pain, normal range of motion and neck supple. No spinous process tenderness or muscular tenderness. Normal range of motion.     Right lower leg: No edema.     Left lower leg: No edema.  Skin:    General: Skin is warm and dry.     Capillary Refill: Capillary refill takes less than 2 seconds.     Findings: No ecchymosis, erythema, rash or wound.  Neurological:     General: No focal deficit  present.     Mental Status: She is alert and oriented to person, place, and time.     GCS: GCS eye subscore is 4. GCS verbal subscore is 5. GCS motor subscore is 6.     Cranial Nerves: Cranial nerves 2-12 are intact.     Sensory: Sensation is intact.     Motor: Motor function is intact.     Coordination: Coordination is intact.  Psychiatric:        Attention and Perception: Attention normal.        Mood and Affect: Mood normal.        Speech: Speech normal.        Behavior: Behavior normal.     ED Results / Procedures / Treatments   Labs (all labs ordered are listed, but only abnormal results are displayed) Labs Reviewed  BASIC METABOLIC PANEL - Abnormal; Notable for the following components:      Result Value   Potassium 3.3 (*)    All other components within normal limits  CBC - Abnormal; Notable for the following components:   RBC 3.67 (*)    Hemoglobin 11.0 (*)    HCT 33.8 (*)    All other components within normal limits  D-DIMER, QUANTITATIVE - Abnormal; Notable for the following components:   D-Dimer, Quant 0.81 (*)    All other components within normal limits  LIPASE, BLOOD  I-STAT BETA HCG BLOOD, ED ([facility], [facility], [facility] ONLY)  TROPONIN I (HIGH SENSITIVITY)  TROPONIN I (HIGH SENSITIVITY)    EKG EKG Interpretation  Date/Time:  Monday December 21 2021 19:54:52 EDT Ventricular Rate:  80 PR Interval:  202 QRS Duration: 82 QT Interval:  370 QTC Calculation: 427 R Axis:   83 Text Interpretation: Sinus rhythm Borderline prolonged PR interval Confirmed by Gilda Crease [phone]) on 12/22/2021 4:00:54 AM  Radiology DG Chest 2 View  Result Date: 12/21/2021 CLINICAL DATA:  Chest pain EXAM: CHEST - 2 VIEW COMPARISON:  Abdominal series 11/18/2016 FINDINGS: The heart size and mediastinal contours are within normal limits. Both lungs are clear. The visualized skeletal structures are unremarkable. IMPRESSION: No active cardiopulmonary disease. Electronically Signed   By: Darliss Cheney M.D.   On: 12/21/2021 20:14    Procedures Procedures    Medications Ordered in ED Medications - No data to display  ED Course/ Medical Decision Making/ A&P                           Medical Decision Making Amount and/or Complexity of Data Reviewed Labs: ordered. Radiology: ordered.   Patient presents for chest pain.  Patient  does not have any significant cardiac risk factors.  EKG, troponins negative.  Chest x-ray clear.  Patient does report that she recently started vaping and that seems to aggravate the pain.  Additionally she does have chest wall tenderness.  This seems consistent with possible inflammatory process in the lungs or chest wall, however patient does have some significant family history.  Patient's mother has lupus, father had DVT and grandparent also had clotting disorder.  It was felt imperative to rule out PE in this patient even though she is not tachycardic, tachypneic or hypoxic.  D-dimer ordered and is elevated.  Will require CT angiography. Will sign out to oncoming ER physician to follow-up - discharge if negative.        Final Clinical Impression(s) / ED Diagnoses Final diagnoses:  Chest wall pain    Rx / DC Orders ED Discharge Orders     None         Gilda Crease, MD 12/22/21 [phone]
